# Patient Record
Sex: Female | Born: 1985 | Race: Black or African American | Hispanic: Yes | Marital: Married | Smoking: Never smoker
Health system: Southern US, Community
[De-identification: ages and names within clinical notes are randomized; demographics above are authoritative.]

## PROBLEM LIST (undated history)

## (undated) DIAGNOSIS — J4520 Mild intermittent asthma, uncomplicated: Secondary | ICD-10-CM

---

## 2020-11-12 ENCOUNTER — Encounter: Payer: Self-pay | Admitting: Internal Medicine

## 2020-11-12 ENCOUNTER — Emergency Department: Payer: 59

## 2020-11-12 ENCOUNTER — Other Ambulatory Visit: Payer: Self-pay

## 2020-11-12 ENCOUNTER — Inpatient Hospital Stay
Admission: EM | Admit: 2020-11-12 | Discharge: 2020-11-14 | DRG: 987 | Disposition: A | Discharge status: Home / Self Care | Payer: 59 | Attending: Internal Medicine | Admitting: Internal Medicine

## 2020-11-12 DIAGNOSIS — K810 Acute cholecystitis: Secondary | ICD-10-CM | POA: Diagnosis present

## 2020-11-12 DIAGNOSIS — I4891 Unspecified atrial fibrillation: Principal | ICD-10-CM | POA: Diagnosis present

## 2020-11-12 DIAGNOSIS — Z6841 Body Mass Index (BMI) 40.0 and over, adult: Secondary | ICD-10-CM | POA: Diagnosis not present

## 2020-11-12 DIAGNOSIS — A419 Sepsis, unspecified organism: Secondary | ICD-10-CM | POA: Diagnosis present

## 2020-11-12 DIAGNOSIS — Z20822 Contact with and (suspected) exposure to covid-19: Secondary | ICD-10-CM | POA: Diagnosis present

## 2020-11-12 DIAGNOSIS — J4520 Mild intermittent asthma, uncomplicated: Secondary | ICD-10-CM

## 2020-11-12 DIAGNOSIS — R1013 Epigastric pain: Secondary | ICD-10-CM | POA: Diagnosis present

## 2020-11-12 DIAGNOSIS — G4733 Obstructive sleep apnea (adult) (pediatric): Secondary | ICD-10-CM | POA: Diagnosis present

## 2020-11-12 DIAGNOSIS — R0789 Other chest pain: Secondary | ICD-10-CM

## 2020-11-12 DIAGNOSIS — E876 Hypokalemia: Secondary | ICD-10-CM | POA: Diagnosis present

## 2020-11-12 DIAGNOSIS — R112 Nausea with vomiting, unspecified: Secondary | ICD-10-CM | POA: Diagnosis present

## 2020-11-12 DIAGNOSIS — R002 Palpitations: Secondary | ICD-10-CM | POA: Diagnosis present

## 2020-11-12 HISTORY — DX: Morbid (severe) obesity due to excess calories: E66.01

## 2020-11-12 HISTORY — DX: Mild intermittent asthma, uncomplicated: J45.20

## 2020-11-12 LAB — COMPREHENSIVE METABOLIC PANEL
ALT: 19 U/L (ref 0–44)
AST: 30 U/L (ref 15–41)
Albumin: 4.2 g/dL (ref 3.5–5.0)
Alkaline Phosphatase: 70 U/L (ref 38–126)
Anion gap: 14 (ref 5–15)
BUN: 8 mg/dL (ref 6–20)
CO2: 23 mmol/L (ref 22–32)
Calcium: 9.1 mg/dL (ref 8.9–10.3)
Chloride: 99 mmol/L (ref 98–111)
Creatinine, Ser: 0.8 mg/dL (ref 0.44–1.00)
GFR, Estimated: >60 mL/min (ref >60)
Glucose, Bld: 116 mg/dL — ABNORMAL HIGH (ref 70–99)
Potassium: 3.6 mmol/L (ref 3.5–5.1)
Sodium: 136 mmol/L (ref 135–145)
Total Bilirubin: 0.8 mg/dL (ref 0.3–1.2)
Total Protein: 8.8 g/dL — ABNORMAL HIGH (ref 6.5–8.1)

## 2020-11-12 LAB — URINE DRUG SCREEN, QUALITATIVE (ARMC ONLY)
Amphetamines, Ur Screen: NOT DETECTED
Barbiturates, Ur Screen: NOT DETECTED
Benzodiazepine, Ur Scrn: NOT DETECTED
Cannabinoid 50 Ng, Ur ~~LOC~~: NOT DETECTED
Cocaine Metabolite,Ur ~~LOC~~: NOT DETECTED
MDMA (Ecstasy)Ur Screen: NOT DETECTED
Methadone Scn, Ur: NOT DETECTED
Opiate, Ur Screen: NOT DETECTED
Phencyclidine (PCP) Ur S: NOT DETECTED
Tricyclic, Ur Screen: NOT DETECTED

## 2020-11-12 LAB — CBC WITH DIFFERENTIAL/PLATELET
Abs Immature Granulocytes: 0.06 10*3/uL (ref 0.00–0.07)
Basophils Absolute: 0.1 10*3/uL (ref 0.0–0.1)
Basophils Relative: 1 %
Eosinophils Absolute: 0 10*3/uL (ref 0.0–0.5)
Eosinophils Relative: 0 %
HCT: 42.2 % (ref 36.0–46.0)
Hemoglobin: 13 g/dL (ref 12.0–15.0)
Immature Granulocytes: 0 %
Lymphocytes Relative: 11 %
Lymphs Abs: 1.6 10*3/uL (ref 0.7–4.0)
MCH: 23.3 pg — ABNORMAL LOW (ref 26.0–34.0)
MCHC: 30.8 g/dL (ref 30.0–36.0)
MCV: 75.8 fL — ABNORMAL LOW (ref 80.0–100.0)
Monocytes Absolute: 0.6 10*3/uL (ref 0.1–1.0)
Monocytes Relative: 4 %
Neutro Abs: 12.3 10*3/uL — ABNORMAL HIGH (ref 1.7–7.7)
Neutrophils Relative %: 84 %
Platelets: 290 10*3/uL (ref 150–400)
RBC: 5.57 MIL/uL — ABNORMAL HIGH (ref 3.87–5.11)
RDW: 14.6 % (ref 11.5–15.5)
WBC: 14.6 10*3/uL — ABNORMAL HIGH (ref 4.0–10.5)
nRBC: 0 % (ref 0.0–0.2)

## 2020-11-12 LAB — URINALYSIS, ROUTINE W REFLEX MICROSCOPIC
Bacteria, UA: NONE SEEN
Bilirubin Urine: NEGATIVE
Glucose, UA: NEGATIVE mg/dL
Ketones, ur: 20 mg/dL — AB
Leukocytes,Ua: NEGATIVE
Nitrite: NEGATIVE
Protein, ur: 30 mg/dL — AB
Specific Gravity, Urine: 1.011 (ref 1.005–1.030)
WBC, UA: NONE SEEN WBC/hpf (ref 0–5)
pH: 7 (ref 5.0–8.0)

## 2020-11-12 LAB — LACTIC ACID, PLASMA
Lactic Acid, Venous: 1.5 mmol/L (ref 0.5–1.9)
Lactic Acid, Venous: 1.8 mmol/L (ref 0.5–1.9)

## 2020-11-12 LAB — RESP PANEL BY RT-PCR (FLU A&B, COVID) ARPGX2
Influenza A by PCR: NEGATIVE
Influenza B by PCR: NEGATIVE
SARS Coronavirus 2 by RT PCR: NEGATIVE

## 2020-11-12 LAB — LIPASE, BLOOD: Lipase: 24 U/L (ref 11–51)

## 2020-11-12 LAB — TROPONIN I (HIGH SENSITIVITY)
Troponin I (High Sensitivity): 14 ng/L (ref <18)
Troponin I (High Sensitivity): 14 ng/L (ref <18)

## 2020-11-12 LAB — MAGNESIUM: Magnesium: 1.8 mg/dL (ref 1.7–2.4)

## 2020-11-12 LAB — POC URINE PREG, ED: Preg Test, Ur: NEGATIVE

## 2020-11-12 MED ORDER — ALBUTEROL SULFATE (2.5 MG/3ML) 0.083% IN NEBU: 2.5000 mg | INHALATION_SOLUTION | RESPIRATORY_TRACT | Status: DC | PRN

## 2020-11-12 MED ORDER — DILTIAZEM HCL 25 MG/5ML IV SOLN
20.0000 mg | Freq: Once | INTRAVENOUS | Status: AC
  Administered 2020-11-12: 20 mg via INTRAVENOUS
  Filled 2020-11-12: qty 5

## 2020-11-12 MED ORDER — ACETAMINOPHEN 650 MG RE SUPP: 650.0000 mg | Freq: Four times a day (QID) | RECTAL | Status: DC | PRN

## 2020-11-12 MED ORDER — PIPERACILLIN-TAZOBACTAM 3.375 G IVPB 30 MIN
3.3750 g | Freq: Once | INTRAVENOUS | Status: AC
  Administered 2020-11-12: 3.375 g via INTRAVENOUS
  Filled 2020-11-12: qty 50

## 2020-11-12 MED ORDER — NALOXONE HCL 2 MG/2ML IJ SOSY
0.4000 mg | PREFILLED_SYRINGE | INTRAMUSCULAR | Status: DC | PRN
  Filled 2020-11-12: qty 2

## 2020-11-12 MED ORDER — LACTATED RINGERS IV SOLN: INTRAVENOUS | Status: DC

## 2020-11-12 MED ORDER — HYDROMORPHONE HCL 1 MG/ML IJ SOLN
1.0000 mg | INTRAMUSCULAR | Status: DC | PRN
  Administered 2020-11-13: 1 mg via INTRAVENOUS
  Filled 2020-11-12: qty 1

## 2020-11-12 MED ORDER — ONDANSETRON HCL 4 MG/2ML IJ SOLN: 4.0000 mg | Freq: Four times a day (QID) | INTRAMUSCULAR | Status: DC | PRN

## 2020-11-12 MED ORDER — ACETAMINOPHEN 325 MG PO TABS
650.0000 mg | ORAL_TABLET | Freq: Four times a day (QID) | ORAL | Status: DC | PRN
  Administered 2020-11-13 – 2020-11-14 (×3): 650 mg via ORAL
  Filled 2020-11-12 (×3): qty 2

## 2020-11-12 MED ORDER — DILTIAZEM HCL 25 MG/5ML IV SOLN
INTRAVENOUS | Status: AC
  Filled 2020-11-12: qty 5

## 2020-11-12 MED ORDER — MORPHINE SULFATE (PF) 4 MG/ML IV SOLN: 4.0000 mg | INTRAVENOUS | Status: DC | PRN

## 2020-11-12 MED ORDER — PIPERACILLIN-TAZOBACTAM 3.375 G IVPB
3.3750 g | Freq: Three times a day (TID) | INTRAVENOUS | Status: DC
  Administered 2020-11-13 (×2): 3.375 g via INTRAVENOUS
  Filled 2020-11-12 (×5): qty 50

## 2020-11-12 MED ORDER — MORPHINE SULFATE (PF) 4 MG/ML IV SOLN
4.0000 mg | Freq: Once | INTRAVENOUS | Status: AC
  Administered 2020-11-12: 4 mg via INTRAVENOUS
  Filled 2020-11-12: qty 1

## 2020-11-12 MED ORDER — DILTIAZEM LOAD VIA INFUSION
10.0000 mg | Freq: Once | INTRAVENOUS | Status: AC
  Administered 2020-11-12: 10 mg via INTRAVENOUS
  Filled 2020-11-12: qty 10

## 2020-11-12 MED ORDER — DILTIAZEM HCL-DEXTROSE 125-5 MG/125ML-% IV SOLN (PREMIX)
5.0000 mg/h | INTRAVENOUS | Status: DC
  Administered 2020-11-12: 5 mg/h via INTRAVENOUS
  Administered 2020-11-13: 15 mg/h via INTRAVENOUS
  Filled 2020-11-12 (×2): qty 125

## 2020-11-12 MED ORDER — SODIUM CHLORIDE 0.9 % IV BOLUS
1000.0000 mL | Freq: Once | INTRAVENOUS | Status: AC
  Administered 2020-11-12: 1000 mL via INTRAVENOUS

## 2020-11-12 NOTE — ED Triage Notes (Signed)
Pt BIB EMS from home c/o rapid HR and mid sternal CP that started around 1000. Pt endorses N/V. Pt has no cardiac hx

## 2020-11-12 NOTE — Progress Notes (Signed)
Pharmacy Antibiotic Note  Gail Wilkinson is a 34 y.o. female admitted on 11/12/2020 with Intra-abdominal infection.  Pharmacy has been consulted for Zosyn dosing.  Plan: Zosyn 3.375g IV q8h (4 hour infusion).     Temp (24hrs), Avg:98.2 F (36.8 C), Min:98.2 F (36.8 C), Max:98.2 F (36.8 C)  Recent Labs  Lab 11/12/20 1545  WBC 14.6*  CREATININE 0.80    CrCl cannot be calculated (Unknown ideal weight.).    Allergies  Allergen Reactions  . Shellfish Allergy Itching    Antimicrobials this admission: Zosyn 12/9 >>   Dose adjustments this admission:  Microbiology results:  Thank you for allowing pharmacy to be a part of this patient's care.  Clovia Cuff, PharmD, BCPS 11/12/2020 8:19 PM

## 2020-11-12 NOTE — H&P (Signed)
History and Physical    PLEASE NOTE THAT DRAGON DICTATION SOFTWARE WAS USED IN THE CONSTRUCTION OF THIS NOTE.   Gail Wilkinson RSW:[id number] DOB: 1986/11/13 DOA: 11/12/2020  PCP: Patient, No Pcp Per Patient coming from: home   I have personally briefly reviewed patient's old medical records in [place]  Chief Complaint: Palpitations  HPI: Gail Wilkinson is a 34 y.o. female with medical history significant for mild intermittent asthma who is admitted to [facility] on 11/12/2020 with atrial fibrillation with RVR after presenting from home to [facility] complaining of palpitations.   The patient reports 1 day of palpitations associated with lightheadedness in the absence of any syncope.  She also reports intermittent nausea resulting in 3 episodes of nonbloody, nonbilious emesis over that timeframe.  Denies associated overt chest discomfort, diaphoresis, or shortness of breath.  Over the last day she has also noted new onset epigastric discomfort.  She describes this pain as sharp, nonradiating pain, that is exacerbated with palpation over the epigastrium.  Denies any recent trauma.  Denies any associated diarrhea, melena, or hematochezia.    Denies subjective fever, chills, rigors, or generalized myalgias.  Denies any associated recent headaches, neck stiffness, rhinitis, rhinorrhea, sore throat, wheezing, or rash.  No recent traveling or known COVID-19 exposures.  Denies any associated dysuria, gross hematuria, or change in urinary urgency/frequency.   The patient conveys that she has no known underlying history of atrial fibrillation or any known history of gallbladder pathology.  Not on any blood thinning agents at home.  Her past medical history appears limited to mild intermittent, with most recent exacerbation of such occurring approximately 4 years ago in 2017.  Denies any routine consumption of alcohol and denies any recreational drug  use.    ED Course:  Vital signs in the ED were notable for the following: Temperature max 98.2; heart rate initially noted in the 150s to 160s which is decreased slightly into the 120s to 140s following initiation of diltiazem drip, as further described below; blood pressure 144/98; respiratory rate 17-22; oxygen saturation 97 to 100% on room air.  Labs were notable for the following: CMP was notable for the following: Sodium 136, potassium 3.6, bicarbonate 23, anion gap 14, BUN 18, creatinine 0.8.  Liver enzymes were found to be within normal limits, including hyperbilirubinemia.  Urine pregnancy was found to be negative today.  Serum magnesium 1.8.  High-sensitivity troponin I x2 were found to be 14.  CBC was notable for the following: White blood cell count of 14,600 with 84% neutrophils, hemoglobin 13.  Nasopharyngeal COVID-19/influenza PCR performed in the ED this evening found to be negative.  Chest x-ray showed no evidence of acute cardiopulmonary process, clear no evidence of infiltrate, edema, effusion, or pneumothorax.  Right upper quadrant abdominal ultrasound showed evidence of cholelithiasis associated with gallbladder wall thickening and trace free pericholecystic fluid suggestive of acute cholelithiasis, while showing mild dilation of the common bile duct to 7.2 mm in the absence of any overt radiopaque stone within the common bile duct.  Presenting EKG showed atrial fibrillation with RVR, ventricular rate 160, and nonspecific T wave inversion in lead III, without prior EKG available for point comparison, while showing no evidence is, including no evidence of ST elevation.  The patient's case and imaging were discussed with the on-call general surgeon, Dr. Lysle Pearl, who agreed that presentation, findings, and imaging results were suggestive of acute cholecystitis without clinical suggestion of choledocholithiasis. Dr. Lysle Pearl will plan to  take patient to the OR tomorrow for acute cholecystitis  so as adequate rate control occurred in the interval.  While in the ED, the following were administered: Diltiazem 10 mg IV bolus x1 followed by initiation of diltiazem drip.  Morphine 4 L IV x1, a 1 L normal saline bolus, and 1 dose of IV Zosyn per pharmacy consultation.     Review of Systems: As per HPI otherwise 10 point review of systems negative.   Past Medical History:  Diagnosis Date  . Mild intermittent asthma     History reviewed. No pertinent surgical history.  Social History:  reports that she has never smoked. She has never used smokeless tobacco. She reports previous alcohol use. She reports that she does not use drugs.   Allergies  Allergen Reactions  . Shellfish Allergy Itching    Prior to Admission medications   Not on File  As needed albuterol inhaler represents the patient's only prescription medication at home.  She denies any use of over-the-counter medications.   Objective    Physical Exam: Vitals:   11/12/20 1815 11/12/20 1830 11/12/20 1836 11/12/20 1845  BP:  (!) 156/127 (!) 144/98   Pulse: (!) 150  91 (!) 140  Resp: (!) 22  (!) 22 (!) 25  Temp:      TempSrc:      SpO2: 100%  98% 93%    General: appears to be stated age; alert, oriented Skin: warm, dry, no rash Head:  AT/[place] Eyes:  PEARL b/l, EOMI Mouth:  Oral mucosa membranes appear dry, normal dentition Neck: supple; trachea midline Heart:  tachycardic; did not appreciate any M/R/G Lungs: CTAB, did not appreciate any wheezes, rales, or rhonchi Abdomen: + BS; soft, ND; mild epigastric bruit to palpation over that site in the absence of any associated guarding, rigidity, or rebound tenderness. Vascular: 2+ pedal pulses b/l; 2+ radial pulses b/l Extremities: no peripheral edema, no muscle wasting Neuro: strength and sensation intact in upper and lower extremities b/l    Labs on Admission: I have personally reviewed following labs and imaging studies  CBC: Recent Labs  Lab  11/12/20 1545  WBC 14.6*  NEUTROABS 12.3*  HGB 13.0  HCT 42.2  MCV 75.8*  PLT 383   Basic Metabolic Panel: Recent Labs  Lab 11/12/20 1545  NA 136  K 3.6  CL 99  CO2 23  GLUCOSE 116*  BUN 8  CREATININE 0.80  CALCIUM 9.1  MG 1.8   GFR: CrCl cannot be calculated (Unknown ideal weight.). Liver Function Tests: Recent Labs  Lab 11/12/20 1545  AST 30  ALT 19  ALKPHOS 70  BILITOT 0.8  PROT 8.8*  ALBUMIN 4.2   Recent Labs  Lab 11/12/20 1545  LIPASE 24   No results for input(s): AMMONIA in the last 168 hours. Coagulation Profile: No results for input(s): INR, PROTIME in the last 168 hours. Cardiac Enzymes: No results for input(s): CKTOTAL, CKMB, CKMBINDEX, TROPONINI in the last 168 hours. BNP (last 3 results) No results for input(s): PROBNP in the last 8760 hours. HbA1C: No results for input(s): HGBA1C in the last 72 hours. CBG: No results for input(s): GLUCAP in the last 168 hours. Lipid Profile: No results for input(s): CHOL, HDL, LDLCALC, TRIG, CHOLHDL, LDLDIRECT in the last 72 hours. Thyroid Function Tests: No results for input(s): TSH, T4TOTAL, FREET4, T3FREE, THYROIDAB in the last 72 hours. Anemia Panel: No results for input(s): VITAMINB12, FOLATE, FERRITIN, TIBC, IRON, RETICCTPCT in the last 72 hours. Urine analysis: No  results found for: COLORURINE, APPEARANCEUR, [place], [place], GLUCOSEU, HGBUR, BILIRUBINUR, KETONESUR, PROTEINUR, UROBILINOGEN, NITRITE, LEUKOCYTESUR  Radiological Exams on Admission: DG Chest Portable 1 View  Result Date: 11/12/2020 CLINICAL DATA:  new onset Afib, eval infiltrate EXAM: PORTABLE CHEST 1 VIEW COMPARISON:  None. FINDINGS: No focal consolidation. No pneumothorax or pleural effusion. Cardiomediastinal silhouette is within normal limits. No acute osseous abnormality. IMPRESSION: No focal airspace disease. Electronically Signed   By: Primitivo Gauze M.D.   On: 11/12/2020 16:12   US Abdomen Limited RUQ (LIVER/GB)  Result  Date: 11/12/2020 CLINICAL DATA:  Initial evaluation for acute epigastric pain, vomiting. EXAM: ULTRASOUND ABDOMEN LIMITED RIGHT UPPER QUADRANT COMPARISON:  None. FINDINGS: Gallbladder: Multiple shadowing echogenic stones seen within the gallbladder lumen, largest of which measures up to 1.6 cm. Gallbladder wall thickened and edematous in appearance measuring up to 10-11 mm. Probable trace free pericholecystic fluid. No sonographic Murphy sign elicited on exam. Common bile duct: Diameter: 7.2 mm Liver: 1.1 x 0.8 x 0.8 cm benign appearing cyst noted within the left hepatic lobe. 1.0 x 1.0 x 1.0 cm cyst noted within the right hepatic lobe. Additional right hepatic lobe cysts measure 0.9 x 1.0 x 0.8 cm. Increased echogenicity within the underlying hepatic parenchyma. Portal vein is patent on color Doppler imaging with normal direction of blood flow towards the liver. Other: None. IMPRESSION: 1. Cholelithiasis with associated gallbladder wall thickening and trace free pericholecystic fluid. Clinical correlation for possible acute cholecystitis recommended. 2. Mild dilatation of the common bile duct measuring up to 7.2 mm. Correlation with LFTs recommended. 3. Increased echogenicity within the hepatic parenchyma, nonspecific, but most commonly seen in the setting of steatosis. 4. Few scattered benign-appearing hepatic cysts measuring up to 1.1 cm as above. Electronically Signed   By: Jeannine Boga M.D.   On: 11/12/2020 18:42     EKG: Independently reviewed, with result as described above.    Assessment/Plan   Gail Wilkinson is a 34 y.o. female with medical history significant for mild intermittent asthma who is admitted to [facility] on 11/12/2020 with atrial fibrillation with RVR after presenting from home to [facility] complaining of palpitations.     Principal Problem:   Atrial fibrillation with RVR (HCC) Active Problems:   Acute cholecystitis   Sepsis  (HCC)   Epigastric pain   Mild intermittent asthma    #) Atrial fibrillation with RVR: This appears to be a new diagnosis for the with presenting with 1 day of palpitations associated with lightheadedness and intermittent nausea resulting in 3 episodes of nonbloody, nonbilious emesis, with heart monitor showing atrial fibrillation with RVR and initial ventricular rates into the 170s, while maintaining blood pressures.  It appears her atrial fibrillation with RVR is precipitated by new finding of sepsis due to acute cholecystitis, as further described.  No overt additional acute contributing factors leading atrial fibrillation with RVR, including no additional overt signs of underlying infectious process, including chest x-ray which showed no evidence of acute pulmonary process, while COVID-19 PCR performed this evening was found to be negative.  will also check urinalysis to evaluate for any additional source of underlying infection.  Denies any associated chest pain, and high-sensitivity troponin I times to evaluate not elevated at 14.  Diltiazem drip was initiated in the ED following a 10 mg IV diltiazem bolus.  Rate appears to be improving, but not yet to goal.  Blood pressure appears to be tolerating the AV nodal blocking interventions.  We will also strive  to improve pain control, as this may represent an additional sympathetic factor playing a role in her atrial fibrillation with RVR. CHA2DS2-VASc of 1 does not meet criteria for anticoagulation for thromboembolic prophylaxis.   Overall, will treat the underlying precipitating patient's atrial fibrillation with RVR, which appears to be Stormy Connon sepsis due to acute cholecystitis, as further described below, with general plan to initiate broad-spectrum antibiotics this evening, before subsequently going to the OR acute cholecystectomy so long as adequate rate control accomplished in the meantime.   Plan: Continue diltiazem drip with goal to maintain  ventricular rates greater than 120 bpm.  Work-up and management of suspected precipitating sepsis due to acute cholecystitis, as further described below.  Add on serum magnesium level.  Check TSH, check urinalysis, check urinary drug screen.  Monitor on telemetry.  Monitor continuous pulse oximetry.  At some point during his hospitalization, following improvement in rate control, anticipate pursuit of echocardiogram.      #) Sepsis due to acute cholecystitis: Diagnosis on the basis of 1 day of epigastric pain associated with intermittent nausea resulting in 3 episodes of nonbloody, nonbilious emesis Gail Wilkinson quadrant ultrasound performed in the ED today showing evidence of cholelithiasis associated with gallbladder wall thickening and trace free pericholecystic fluid suggestive of acute cholecystitis radiopaque choledocholithiasis.  Furthermore, presenting labs are not consistent with a cholestatic for the absence of an occluding stone within the common bile duct at this time.  The patient's case and imaging were discussed with the on-call general surgeon, Dr. Lysle Pearl, who agrees that presentation laboratory studies, and imaging studies, and he plans to take the patient to the OR tomorrow for acute cholecystectomy but rate control has been achieved by that time.  SIRS criteria met via presenting leukocytosis, tachycardia, and tachypnea.  No evidence to suggest associated endorgan damage, and in this context, criteria do not appear to be met for patient sepsis to be considered severe in nature.  No evidence of hypotension.  Lactic acid has been ordered, with result currently pending.  The patient received broad-spectrum antibiotics including anaerobic coverage in the form of Zosyn in the ED tonight.  No additional source of described above, although will check urinalysis to further rule out any additional possibility.  Plan: Lactated Ringer's at 75 cc/h.  Check lactic acid now.  Check blood cultures x2.  I  placed a pharmacy consult for continuation of Zosyn.  Repeat CBC with differential in the morning change as needed IV morphine to as needed IV Dilaudid with the hope of achieving pain control.  Repeat CMP in the morning.  Formal general surgery consult, as above.  Admit the patient n.p.o. at midnight.  Check urinalysis.  As needed IV Zofran.     #) Epigastric pain: 1 day duration, as described above, which appears consistent with acute cholecystitis, with physical exam showing no evidence of peritoneal signs at this time.  Will change from as needed IV morphine as needed IV Dilaudid in an attempt to establish improved pain control, as above.  Plan: Work-up and management of acute cholecystitis, as above.  As needed IV morphine.  Start as needed IV Dilaudid.    #) Nausea and vomiting: The patient reports nausea resulting in 3 episodes of nonbloody, nonbilious emesis sepsis due to acute cholecystitis consequential atrial fibrillation with RVR.   Plan: As needed IV Zofran.  Gentle IV fluids in the form of continuous lactated Ringer's.  Monitor strict I's and O's and daily weights.  We will repeat CMP in the  morning close attention to interval renal function as well associated electrolytes.  Will recheck serum magnesium level in the morning as well.     #) Mild intermittent asthma: On as needed albuterol as her sole outpatient respiratory regimen.  She reports that her most recent prior acute asthma exacerbation occurred in 2017.  No clinical evidence to suggest asthma exacerbation at this time.  Plan: As needed albuterol inhaler.    DVT prophylaxis: SCDs Code Status: Full code Family Communication: Case was discussed with husband Disposition Plan: Per Rounding Team Consults called: Patient's case and imaging were discussed with the on-call general surgeon, as above Admission status: Inpatient; stepdown unit   Of note, this patient was added by me to the following Admit List/Treatment  Team:  [email or web address]     PLEASE NOTE THAT DRAGON DICTATION SOFTWARE WAS USED IN THE CONSTRUCTION OF THIS NOTE.   [facility] Pager [phone] From 12PM- 12AM  Otherwise, please contact night-coverage  [email or web address] Password [email or web address]  11/12/2020, 7:17 PM

## 2020-11-12 NOTE — ED Provider Notes (Signed)
[facility] Emergency Department Provider Note ____________________________________________   Event Date/Time   First MD Initiated Contact with Patient 11/12/20 1525     (approximate)  I have reviewed the triage vital signs and the nursing notes.  HISTORY  Chief Complaint Chest Pain   HPI Gail Wilkinson is a 34 y.o. femalewho presents to the ED for evaluation of epigastric discomfort and palpitations.  Chart review indicates no relevant medical history. Patient self-reports a history of mild asthma with a rescue inhaler at home, and she reports her last asthma attack was in 2017.  She reports using her inhaler once yesterday.  Patient reports being in her typical state of health until today when she developed a sensation of generalized weakness, epigastric discomfort and shortness of breath.  She reports some intermittent sensation of heart palpitations.  She reports that she feels chest/epigastric pain right now, up to 4/10 intensity.  She denies any syncopal episodes, but does report some mild presyncopal lightheaded dizziness.  Further reports 3 episodes of nonbloody nonbilious emesis today.   LMP was last week.  No dysuria, lower abdominal pain, diarrhea, cough, shortness of breath.  She is fully vaccinated for COVID-19 with 2 shots but no booster.   No past medical history on file.  There are no problems to display for this patient.   Prior to Admission medications   Not on File    Allergies Shellfish allergy  No family history on file.  Social History    Review of Systems  Constitutional: No fever/chills.  Positive generalized weakness and presyncope Eyes: No visual changes. ENT: No sore throat. Cardiovascular: Positive for chest pain. Respiratory: Denies shortness of breath. Gastrointestinal: No abdominal pain.  No diarrhea.  No constipation. Positive for nausea and vomiting. Genitourinary: Negative for dysuria. Musculoskeletal:  Negative for back pain. Skin: Negative for rash. Neurological: Negative for headaches, focal weakness or numbness.  ____________________________________________   PHYSICAL EXAM:  VITAL SIGNS: Vitals:   11/12/20 1836 11/12/20 1845  BP: (!) 144/98   Pulse: 91 (!) 140  Resp: (!) 22 (!) 25  Temp:    SpO2: 98% 93%     Constitutional: Alert and oriented.  Obese, sitting up in the stretcher.  Conversational in full sentences.  Appears uncomfortable. Eyes: Conjunctivae are normal. PERRL. EOMI. Head: Atraumatic. Nose: No congestion/rhinnorhea. Mouth/Throat: Mucous membranes are moist.  Oropharynx non-erythematous. Neck: No stridor. No cervical spine tenderness to palpation. Cardiovascular: Tachycardic and irregular. Grossly normal heart sounds.  Good peripheral circulation. Respiratory: Normal respiratory effort.  No retractions. Lungs CTAB. Gastrointestinal: Soft , nondistended, nontender to palpation. No CVA tenderness. Musculoskeletal: No lower extremity tenderness nor edema.  No joint effusions. No signs of acute trauma. Neurologic:  Normal speech and language. No gross focal neurologic deficits are appreciated. No gait instability noted. Skin:  Skin is warm, dry and intact. No rash noted. Psychiatric: Mood and affect are normal. Speech and behavior are normal.  ____________________________________________   LABS (all labs ordered are listed, but only abnormal results are displayed)  Labs Reviewed  CBC WITH DIFFERENTIAL/PLATELET - Abnormal; Notable for the following components:      Result Value   WBC 14.6 (*)    RBC 5.57 (*)    MCV 75.8 (*)    MCH 23.3 (*)    Neutro Abs 12.3 (*)    All other components within normal limits  COMPREHENSIVE METABOLIC PANEL - Abnormal; Notable for the following components:   Glucose, Bld 116 (*)    Total  Protein 8.8 (*)    All other components within normal limits  RESP PANEL BY RT-PCR (FLU A&B, COVID) ARPGX2  MAGNESIUM  LIPASE, BLOOD   POC URINE PREG, ED  TROPONIN I (HIGH SENSITIVITY)  TROPONIN I (HIGH SENSITIVITY)   ____________________________________________  12 Lead EKG  A. fib, rate of 168 bpm.  Normal axis.  Appropriate intervals.  No upsloping delta waves to suggest WPW.  T wave inversions to inferior leads, no further evidence of acute ischemia or STEMI criteria. ____________________________________________  RADIOLOGY  ED MD interpretation: One view CXR reviewed by me without evidence of acute cardiopulmonary pathology. RUQ ultrasound reviewed by me with evidence of cholelithiasis and cholecystitis with pericholecystic fluid and gallbladder wall thickening.  Official radiology report(s): DG Chest Portable 1 View  Result Date: 11/12/2020 CLINICAL DATA:  new onset Afib, eval infiltrate EXAM: PORTABLE CHEST 1 VIEW COMPARISON:  None. FINDINGS: No focal consolidation. No pneumothorax or pleural effusion. Cardiomediastinal silhouette is within normal limits. No acute osseous abnormality. IMPRESSION: No focal airspace disease. Electronically Signed   By: Stana Bunting M.D.   On: 11/12/2020 16:12   US Abdomen Limited RUQ (LIVER/GB)  Result Date: 11/12/2020 CLINICAL DATA:  Initial evaluation for acute epigastric pain, vomiting. EXAM: ULTRASOUND ABDOMEN LIMITED RIGHT UPPER QUADRANT COMPARISON:  None. FINDINGS: Gallbladder: Multiple shadowing echogenic stones seen within the gallbladder lumen, largest of which measures up to 1.6 cm. Gallbladder wall thickened and edematous in appearance measuring up to 10-11 mm. Probable trace free pericholecystic fluid. No sonographic Murphy sign elicited on exam. Common bile duct: Diameter: 7.2 mm Liver: 1.1 x 0.8 x 0.8 cm benign appearing cyst noted within the left hepatic lobe. 1.0 x 1.0 x 1.0 cm cyst noted within the right hepatic lobe. Additional right hepatic lobe cysts measure 0.9 x 1.0 x 0.8 cm. Increased echogenicity within the underlying hepatic parenchyma. Portal vein is  patent on color Doppler imaging with normal direction of blood flow towards the liver. Other: None. IMPRESSION: 1. Cholelithiasis with associated gallbladder wall thickening and trace free pericholecystic fluid. Clinical correlation for possible acute cholecystitis recommended. 2. Mild dilatation of the common bile duct measuring up to 7.2 mm. Correlation with LFTs recommended. 3. Increased echogenicity within the hepatic parenchyma, nonspecific, but most commonly seen in the setting of steatosis. 4. Few scattered benign-appearing hepatic cysts measuring up to 1.1 cm as above. Electronically Signed   By: Rise Mu M.D.   On: 11/12/2020 18:42    ____________________________________________   PROCEDURES and INTERVENTIONS  Procedure(s) performed (including Critical Care):  .Critical Care Performed by: Delton Prairie, MD Authorized by: Delton Prairie, MD   Critical care provider statement:    Critical care time (minutes):  45   Critical care was necessary to treat or prevent imminent or life-threatening deterioration of the following conditions:  Circulatory failure and cardiac failure   Critical care was time spent personally by me on the following activities:  Discussions with consultants, evaluation of patient's response to treatment, examination of patient, ordering and performing treatments and interventions, ordering and review of laboratory studies, ordering and review of radiographic studies, pulse oximetry, re-evaluation of patient's condition, obtaining history from patient or surrogate and review of old charts    Medications  diltiazem (CARDIZEM) 1 mg/mL load via infusion 10 mg (10 mg Intravenous Bolus from Bag 11/12/20 1606)    And  diltiazem (CARDIZEM) 125 mg in dextrose 5% 125 mL (1 mg/mL) infusion (15 mg/hr Intravenous Rate/Dose Change 11/12/20 1719)  sodium chloride 0.9 % bolus  1,000 mL (has no administration in time range)  piperacillin-tazobactam (ZOSYN) IVPB 3.375 g (has  no administration in time range)  diltiazem (CARDIZEM) injection 20 mg (20 mg Intravenous Given 11/12/20 1546)  morphine 4 MG/ML injection 4 mg (4 mg Intravenous Given 11/12/20 1716)    ____________________________________________   MDM / ED COURSE   Obese 34 year old woman presents to the ED with palpitations and chest pain most consistent with new onset A. fib with RVR precipitated by acute cholecystitis requiring medical admission.  Patient tachycardic with A. fib with RVR rates 140-160, but hemodynamically stable and not hypoxic.  Exam demonstrates an uncomfortable appearing patient who has some mild RUQ and epigastric tenderness to palpation without peritoneal features.  She has no evidence of trauma, significant distress or any neurovascular deficits.  Blood work with leukocytosis, otherwise unremarkable.  CXR shows no evidence of acute cardiopulmonary pathology or infiltrates.  Twelve-lead EKG demonstrates A. fib with RVR, which is new for the patient.  Patient tolerates diltiazem boluses well with transient provement of her rates, and was started on a diltiazem drip.  Due to her continued pain, leukocytosis, RUQ ultrasound performed and demonstrates evidence of cholecystitis and the likely precipitating factor causing her A. fib with RVR.  Spoke with general surgery on-call who agrees to see the patient and indicates that we will likely perform cholecystectomy tomorrow.  We will admit the patient to hospitalist medicine for further work-up and management of her A. fib with cholecystitis.   Clinical Course as of 11/12/20 1858  Thu Nov 12, 2020  1559 Transient response after 20 mg of IV diltiazem with rates decreasing to 120s, promptly popped back up to 140/160s.  We will bolus another 2 mg and start a drip.  Patient reports improved chest pain transiently during those decreased rates [DS]  1706 Reassessed.  Husband at the bedside provides additional history.  Patient reports continued epigastric  discomfort despite improving rate control.  She is reporting some tenderness to her epigastrium and RUQ now that she was not before upon my palpation.  Considering her leukocytosis, demographic/body habitus, I am concerned with the possibility of cholelithiasis or cholecystitis contributing to her symptoms and possibly precipitating her A. fib with RVR.  We will order RUQ ultrasound. [DS]  1847 Ultrasound reviewed and noted.  Dr. Tonna Boehringer with general surgery was paged [DS]  1850 Updated patient on diagnosis of acute cholecystitis. [DS]  1610 Spoke with Dr. Tonna Boehringer who agrees to come evaluate the patient later this afternoon.  Indicates that hospitalist admission is warranted considering A. fib with RVR and a dill drip.  He expects to perform cholecystectomy tomorrow pending any changes [DS]    Clinical Course User Index [DS] Delton Prairie, MD    ____________________________________________   FINAL CLINICAL IMPRESSION(S) / ED DIAGNOSES  Final diagnoses:  New onset a-fib (HCC)  Atrial fibrillation with RVR (HCC)  Other chest pain  Epigastric abdominal pain  Acute cholecystitis     ED Discharge Orders    None       Dayna Geurts   Note:  This document was prepared using Dragon voice recognition software and may include unintentional dictation errors.   Delton Prairie, MD 11/12/20 1900

## 2020-11-12 NOTE — Consult Note (Signed)
Subjective:   CC: acute choelcsytitis  HPI:  Gail Wilkinson is a 34 y.o. female who is consulted by Katrinka Blazing for evaluation of above cc.  Symptoms were first noted 1 day ago. Pain is epigastric and chest.  Associated with N/V, exacerbated by nothing specific.  First episode, but has been dealing with "heartburn" for past week.    Past Medical History:  has a past medical history of Mild intermittent asthma.  Past Surgical History: none reported  Family History: reviewed and not relevant to CC  Social History:  reports that she has never smoked. She has never used smokeless tobacco. She reports previous alcohol use. She reports that she does not use drugs.  Current Medications:  Prior to Admission medication   on File    Allergies:  Allergies as of 11/12/2020 - never reviewed  Allergen Reaction Noted  . Shellfish allergy Itching 11/12/2020    ROS:  General: Denies weight loss, weight gain, fatigue, fevers, chills, and night sweats. Eyes: Denies blurry vision, double vision, eye pain, itchy eyes, and tearing. Ears: Denies hearing loss, earache, and ringing in ears. Nose: Denies sinus pain, congestion, infections, runny nose, and nosebleeds. Mouth/throat: Denies hoarseness, sore throat, bleeding gums, and difficulty swallowing. Heart: Denies chest pain, palpitations, racing heart, irregular heartbeat, leg pain or swelling, and decreased activity tolerance. Respiratory: Denies breathing difficulty, shortness of breath, wheezing, cough, and sputum. GI: Denies change in appetite, heartburn, nausea, vomiting, constipation, diarrhea, and blood in stool. GU: Denies difficulty urinating, pain with urinating, urgency, frequency, blood in urine. Musculoskeletal: Denies joint stiffness, pain, swelling, muscle weakness. Skin: Denies rash, itching, mass, tumors, sores, and boils Neurologic: Denies headache, fainting, dizziness, seizures, numbness, and tingling. Psychiatric: Denies depression,  anxiety, difficulty sleeping, and memory loss. Endocrine: Denies heat or cold intolerance, and increased thirst or urination. Blood/lymph: Denies easy bruising, and swollen glands     Objective:     BP (!) 178/115   Pulse 79   Temp 98.2 F (36.8 C) (Oral)   Resp (!) 24   LMP 10/29/2020   SpO2 97%    Constitutional :  alert, cooperative and appears stated age  Lymphatics/Throat:  no asymmetry, masses, or scars  Respiratory:  clear to auscultation bilaterally  Cardiovascular:  regular rate and rhythm  Gastrointestinal: soft, focal TTP in RUQ.   Musculoskeletal: Steady movement  Skin: Cool and moist  Psychiatric: Normal affect, non-agitated, not confused       LABS:  CMP Latest Ref Rng & Units 11/12/2020  Glucose 70 - 99 mg/dL 627(O)  BUN 6 - 20 mg/dL 8  Creatinine 3.50 - 0.93 mg/dL 8.18  Sodium 299 - 371 mmol/L 136  Potassium 3.5 - 5.1 mmol/L 3.6  Chloride 98 - 111 mmol/L 99  CO2 22 - 32 mmol/L 23  Calcium 8.9 - 10.3 mg/dL 9.1  Total Protein 6.5 - 8.1 g/dL 6.9(C)  Total Bilirubin 0.3 - 1.2 mg/dL 0.8  Alkaline Phos 38 - 126 U/L 70  AST 15 - 41 U/L 30  ALT 0 - 44 U/L 19   CBC Latest Ref Rng & Units 11/12/2020  WBC 4.0 - 10.5 K/uL 14.6(H)  Hemoglobin 12.0 - 15.0 g/dL 78.9  Hematocrit 38.1 - 46.0 % 42.2  Platelets 150 - 400 K/uL 290     RADS: CLINICAL DATA:  Initial evaluation for acute epigastric pain, vomiting.  EXAM: ULTRASOUND ABDOMEN LIMITED RIGHT UPPER QUADRANT  COMPARISON:  None.  FINDINGS: Gallbladder:  Multiple shadowing echogenic stones seen within the gallbladder lumen,  largest of which measures up to 1.6 cm. Gallbladder wall thickened and edematous in appearance measuring up to 10-11 mm. Probable trace free pericholecystic fluid. No sonographic Murphy sign elicited on exam.  Common bile duct:  Diameter: 7.2 mm  Liver:  1.1 x 0.8 x 0.8 cm benign appearing cyst noted within the left hepatic lobe. 1.0 x 1.0 x 1.0 cm cyst noted  within the right hepatic lobe. Additional right hepatic lobe cysts measure 0.9 x 1.0 x 0.8 cm. Increased echogenicity within the underlying hepatic parenchyma. Portal vein is patent on color Doppler imaging with normal direction of blood flow towards the liver.  Other: None.  IMPRESSION: 1. Cholelithiasis with associated gallbladder wall thickening and trace free pericholecystic fluid. Clinical correlation for possible acute cholecystitis recommended. 2. Mild dilatation of the common bile duct measuring up to 7.2 mm. Correlation with LFTs recommended. 3. Increased echogenicity within the hepatic parenchyma, nonspecific, but most commonly seen in the setting of steatosis. 4. Few scattered benign-appearing hepatic cysts measuring up to 1.1 cm as above.   Electronically Signed   By: Rise Mu M.D.   On: 11/12/2020 18: Assessment:      Acute cholecystitis afib with RVR  Plan:      Discussed the risk of surgery including post-op infxn, seroma, biloma, chronic pain, poor-delayed wound healing, retained gallstone, conversion to open procedure, post-op SBO or ileus, and need for additional procedures to address said risks.  The risks of general anesthetic including MI, CVA, sudden death or even reaction to anesthetic medications also discussed. Alternatives include continued observation.  Benefits include possible symptom relief, prevention of complications including acute cholecystitis, pancreatitis.  Typical post operative recovery of 3-5 days rest, continued pain in area and incision sites, possible loose stools up to 4-6 weeks, also discussed.  The patient understands the risks, any and all questions were answered to the patient's satisfaction.  IV abx, NPO,  to OR in am.  Hopefully give time for afib rate to be better controlled at that time.  Will discuss with anesthesia possibly moving forward vs abx management vs possible chole tube if afib increases  perioperative risk a significant amount

## 2020-11-13 ENCOUNTER — Inpatient Hospital Stay: Payer: 59 | Admitting: Anesthesiology

## 2020-11-13 ENCOUNTER — Other Ambulatory Visit: Payer: Self-pay

## 2020-11-13 ENCOUNTER — Encounter
Admission: EM | Disposition: A | Discharge status: Home / Self Care | Payer: Self-pay | Source: Home / Self Care | Attending: Internal Medicine

## 2020-11-13 LAB — TSH: TSH: 0.623 u[IU]/mL (ref 0.350–4.500)

## 2020-11-13 LAB — CBC WITH DIFFERENTIAL/PLATELET
Abs Immature Granulocytes: 0.03 10*3/uL (ref 0.00–0.07)
Basophils Absolute: 0.1 10*3/uL (ref 0.0–0.1)
Basophils Relative: 0 %
Eosinophils Absolute: 0 10*3/uL (ref 0.0–0.5)
Eosinophils Relative: 0 %
HCT: 36.8 % (ref 36.0–46.0)
Hemoglobin: 11.3 g/dL — ABNORMAL LOW (ref 12.0–15.0)
Immature Granulocytes: 0 %
Lymphocytes Relative: 15 %
Lymphs Abs: 2 10*3/uL (ref 0.7–4.0)
MCH: 23.6 pg — ABNORMAL LOW (ref 26.0–34.0)
MCHC: 30.7 g/dL (ref 30.0–36.0)
MCV: 76.8 fL — ABNORMAL LOW (ref 80.0–100.0)
Monocytes Absolute: 0.9 10*3/uL (ref 0.1–1.0)
Monocytes Relative: 6 %
Neutro Abs: 10.3 10*3/uL — ABNORMAL HIGH (ref 1.7–7.7)
Neutrophils Relative %: 79 %
Platelets: 281 10*3/uL (ref 150–400)
RBC: 4.79 MIL/uL (ref 3.87–5.11)
RDW: 14.9 % (ref 11.5–15.5)
WBC: 13.3 10*3/uL — ABNORMAL HIGH (ref 4.0–10.5)
nRBC: 0 % (ref 0.0–0.2)

## 2020-11-13 LAB — COMPREHENSIVE METABOLIC PANEL
ALT: 26 U/L (ref 0–44)
AST: 35 U/L (ref 15–41)
Albumin: 3.5 g/dL (ref 3.5–5.0)
Alkaline Phosphatase: 66 U/L (ref 38–126)
Anion gap: 9 (ref 5–15)
BUN: 6 mg/dL (ref 6–20)
CO2: 28 mmol/L (ref 22–32)
Calcium: 8.3 mg/dL — ABNORMAL LOW (ref 8.9–10.3)
Chloride: 97 mmol/L — ABNORMAL LOW (ref 98–111)
Creatinine, Ser: 0.77 mg/dL (ref 0.44–1.00)
GFR, Estimated: >60 mL/min (ref >60)
Glucose, Bld: 122 mg/dL — ABNORMAL HIGH (ref 70–99)
Potassium: 3.5 mmol/L (ref 3.5–5.1)
Sodium: 134 mmol/L — ABNORMAL LOW (ref 135–145)
Total Bilirubin: 0.7 mg/dL (ref 0.3–1.2)
Total Protein: 7.4 g/dL (ref 6.5–8.1)

## 2020-11-13 LAB — PROTIME-INR
INR: 1.1 (ref 0.8–1.2)
Prothrombin Time: 14.1 seconds (ref 11.4–15.2)

## 2020-11-13 LAB — ETHANOL: Alcohol, Ethyl (B): <10 mg/dL (ref <10)

## 2020-11-13 LAB — MAGNESIUM: Magnesium: 1.8 mg/dL (ref 1.7–2.4)

## 2020-11-13 LAB — HIV ANTIBODY (ROUTINE TESTING W REFLEX): HIV Screen 4th Generation wRfx: NONREACTIVE

## 2020-11-13 SURGERY — CHOLECYSTECTOMY, ROBOT-ASSISTED, LAPAROSCOPIC
Anesthesia: General

## 2020-11-13 MED ORDER — PROPOFOL 10 MG/ML IV BOLUS
INTRAVENOUS | Status: DC | PRN
  Administered 2020-11-13: 200 mg via INTRAVENOUS

## 2020-11-13 MED ORDER — PHENYLEPHRINE HCL (PRESSORS) 10 MG/ML IV SOLN
INTRAVENOUS | Status: DC | PRN
  Administered 2020-11-13: 100 ug via INTRAVENOUS
  Administered 2020-11-13: 50 ug via INTRAVENOUS
  Administered 2020-11-13 (×2): 100 ug via INTRAVENOUS

## 2020-11-13 MED ORDER — SUGAMMADEX SODIUM 500 MG/5ML IV SOLN
INTRAVENOUS | Status: DC | PRN
  Administered 2020-11-13: 250 mg via INTRAVENOUS
  Administered 2020-11-13: 150 mg via INTRAVENOUS

## 2020-11-13 MED ORDER — METOPROLOL TARTRATE 50 MG PO TABS: 25.0000 mg | ORAL_TABLET | Freq: Once | ORAL | Status: DC

## 2020-11-13 MED ORDER — ENOXAPARIN SODIUM 40 MG/0.4ML ~~LOC~~ SOLN
40.0000 mg | SUBCUTANEOUS | Status: DC
  Administered 2020-11-14: 09:00:00 40 mg via SUBCUTANEOUS
  Filled 2020-11-13: qty 0.4

## 2020-11-13 MED ORDER — FLUMAZENIL 0.5 MG/5ML IV SOLN
INTRAVENOUS | Status: DC | PRN
  Administered 2020-11-13: .2 mg via INTRAVENOUS

## 2020-11-13 MED ORDER — ONDANSETRON HCL 4 MG/2ML IJ SOLN
INTRAMUSCULAR | Status: DC | PRN
  Administered 2020-11-13: 4 mg via INTRAVENOUS

## 2020-11-13 MED ORDER — FENTANYL CITRATE (PF) 100 MCG/2ML IJ SOLN
INTRAMUSCULAR | Status: AC
  Filled 2020-11-13: qty 2

## 2020-11-13 MED ORDER — DEXAMETHASONE SODIUM PHOSPHATE 10 MG/ML IJ SOLN
INTRAMUSCULAR | Status: DC | PRN
  Administered 2020-11-13: 8 mg via INTRAVENOUS

## 2020-11-13 MED ORDER — FLUMAZENIL 0.5 MG/5ML IV SOLN
INTRAVENOUS | Status: AC
  Filled 2020-11-13: qty 5

## 2020-11-13 MED ORDER — METOPROLOL TARTRATE 5 MG/5ML IV SOLN
INTRAVENOUS | Status: DC | PRN
  Administered 2020-11-13 (×2): 2.5 mg via INTRAVENOUS

## 2020-11-13 MED ORDER — ROCURONIUM BROMIDE 10 MG/ML (PF) SYRINGE
PREFILLED_SYRINGE | INTRAVENOUS | Status: AC
  Filled 2020-11-13: qty 10

## 2020-11-13 MED ORDER — LIDOCAINE-EPINEPHRINE (PF) 1 %-1:200000 IJ SOLN
INTRAMUSCULAR | Status: DC | PRN
  Administered 2020-11-13: 30 mL

## 2020-11-13 MED ORDER — FENTANYL CITRATE (PF) 100 MCG/2ML IJ SOLN: 25.0000 ug | INTRAMUSCULAR | Status: DC | PRN

## 2020-11-13 MED ORDER — ACETAMINOPHEN 10 MG/ML IV SOLN
INTRAVENOUS | Status: DC | PRN
  Administered 2020-11-13: 1000 mg via INTRAVENOUS

## 2020-11-13 MED ORDER — OXYCODONE HCL 5 MG PO TABS: 5.0000 mg | ORAL_TABLET | Freq: Once | ORAL | Status: DC | PRN

## 2020-11-13 MED ORDER — LIDOCAINE HCL (CARDIAC) PF 100 MG/5ML IV SOSY
PREFILLED_SYRINGE | INTRAVENOUS | Status: DC | PRN
  Administered 2020-11-13: 100 mg via INTRAVENOUS

## 2020-11-13 MED ORDER — SUCCINYLCHOLINE CHLORIDE 200 MG/10ML IV SOSY
PREFILLED_SYRINGE | INTRAVENOUS | Status: AC
  Filled 2020-11-13: qty 10

## 2020-11-13 MED ORDER — MIDAZOLAM HCL 2 MG/2ML IJ SOLN
INTRAMUSCULAR | Status: AC
  Filled 2020-11-13: qty 2

## 2020-11-13 MED ORDER — MIDAZOLAM HCL 2 MG/2ML IJ SOLN
INTRAMUSCULAR | Status: DC | PRN
  Administered 2020-11-13: 2 mg via INTRAVENOUS

## 2020-11-13 MED ORDER — ONDANSETRON HCL 4 MG/2ML IJ SOLN
INTRAMUSCULAR | Status: AC
  Filled 2020-11-13: qty 2

## 2020-11-13 MED ORDER — PROPOFOL 10 MG/ML IV BOLUS
INTRAVENOUS | Status: AC
  Filled 2020-11-13: qty 20

## 2020-11-13 MED ORDER — ROCURONIUM BROMIDE 100 MG/10ML IV SOLN
INTRAVENOUS | Status: DC | PRN
  Administered 2020-11-13 (×2): 10 mg via INTRAVENOUS
  Administered 2020-11-13: 50 mg via INTRAVENOUS
  Administered 2020-11-13: 20 mg via INTRAVENOUS

## 2020-11-13 MED ORDER — SUCCINYLCHOLINE CHLORIDE 20 MG/ML IJ SOLN
INTRAMUSCULAR | Status: DC | PRN
  Administered 2020-11-13: 200 mg via INTRAVENOUS

## 2020-11-13 MED ORDER — OXYCODONE HCL 5 MG/5ML PO SOLN
5.0000 mg | Freq: Once | ORAL | Status: DC | PRN
Start: 2020-11-13 — End: 2020-11-13

## 2020-11-13 MED ORDER — POTASSIUM CHLORIDE 10 MEQ/100ML IV SOLN
10.0000 meq | INTRAVENOUS | Status: AC
Start: 2020-11-13 — End: 2020-11-13

## 2020-11-13 MED ORDER — INDOCYANINE GREEN 25 MG IV SOLR
1.2500 mg | Freq: Once | INTRAVENOUS | Status: AC
  Administered 2020-11-13: 1.25 mg via INTRAVENOUS
  Filled 2020-11-13: qty 10

## 2020-11-13 MED ORDER — FENTANYL CITRATE (PF) 100 MCG/2ML IJ SOLN
INTRAMUSCULAR | Status: DC | PRN
  Administered 2020-11-13: 50 ug via INTRAVENOUS
  Administered 2020-11-13: 100 ug via INTRAVENOUS

## 2020-11-13 MED ORDER — LIDOCAINE HCL (PF) 2 % IJ SOLN
INTRAMUSCULAR | Status: AC
  Filled 2020-11-13: qty 5

## 2020-11-13 MED ORDER — DEXAMETHASONE SODIUM PHOSPHATE 10 MG/ML IJ SOLN
INTRAMUSCULAR | Status: AC
  Filled 2020-11-13: qty 1

## 2020-11-13 MED ORDER — DEXMEDETOMIDINE (PRECEDEX) IN NS 20 MCG/5ML (4 MCG/ML) IV SYRINGE
PREFILLED_SYRINGE | INTRAVENOUS | Status: DC | PRN
  Administered 2020-11-13: 4 ug via INTRAVENOUS
  Administered 2020-11-13 (×2): 2 ug via INTRAVENOUS

## 2020-11-13 MED ORDER — BUPIVACAINE HCL (PF) 0.5 % IJ SOLN
INTRAMUSCULAR | Status: DC | PRN
  Administered 2020-11-13: 30 mL

## 2020-11-13 SURGICAL SUPPLY — 55 items
ANCHOR TIS RET SYS 235ML (MISCELLANEOUS) ×3 IMPLANT
BAG INFUSER PRESSURE 100CC (MISCELLANEOUS) IMPLANT
BLADE SURG SZ11 CARB STEEL (BLADE) ×3 IMPLANT
CANNULA REDUC XI 12-8 STAPL (CANNULA) ×1
CANNULA REDUC XI 12-8MM STAPL (CANNULA) ×1
CANNULA REDUCER 12-8 DVNC XI (CANNULA) ×1 IMPLANT
CATH REDDICK CHOLANGI 4FR 50CM (CATHETERS) IMPLANT
CHLORAPREP W/TINT 26 (MISCELLANEOUS) ×3 IMPLANT
CLIP VESOLOCK MED LG 6/CT (CLIP) ×3 IMPLANT
COVER TIP SHEARS 8 DVNC (MISCELLANEOUS) ×1 IMPLANT
COVER TIP SHEARS 8MM DA VINCI (MISCELLANEOUS) ×2
COVER WAND RF STERILE (DRAPES) ×3 IMPLANT
DECANTER SPIKE VIAL GLASS SM (MISCELLANEOUS) ×3 IMPLANT
DEFOGGER SCOPE WARMER CLEARIFY (MISCELLANEOUS) ×3 IMPLANT
DERMABOND ADVANCED (GAUZE/BANDAGES/DRESSINGS) ×2
DERMABOND ADVANCED .7 DNX12 (GAUZE/BANDAGES/DRESSINGS) ×1 IMPLANT
DRAPE ARM DVNC X/XI (DISPOSABLE) ×4 IMPLANT
DRAPE C-ARM XRAY 36X54 (DRAPES) IMPLANT
DRAPE COLUMN DVNC XI (DISPOSABLE) ×1 IMPLANT
DRAPE DA VINCI XI ARM (DISPOSABLE) ×8
DRAPE DA VINCI XI COLUMN (DISPOSABLE) ×2
ELECT CAUTERY BLADE 6.4 (BLADE) ×3 IMPLANT
ELECT REM PT RETURN 9FT ADLT (ELECTROSURGICAL) ×3
ELECTRODE REM PT RTRN 9FT ADLT (ELECTROSURGICAL) ×1 IMPLANT
GLOVE BIOGEL PI IND STRL 7.0 (GLOVE) ×2 IMPLANT
GLOVE BIOGEL PI INDICATOR 7.0 (GLOVE) ×4
GLOVE SURG SYN 6.5 ES PF (GLOVE) ×6 IMPLANT
GOWN STRL REUS W/ TWL LRG LVL3 (GOWN DISPOSABLE) ×3 IMPLANT
GOWN STRL REUS W/TWL LRG LVL3 (GOWN DISPOSABLE) ×6
GRASPER SUT TROCAR 14GX15 (MISCELLANEOUS) IMPLANT
IRRIGATOR SUCT 8 DISP DVNC XI (IRRIGATION / IRRIGATOR) ×1 IMPLANT
IRRIGATOR SUCTION 8MM XI DISP (IRRIGATION / IRRIGATOR) ×2
IV NS 1000ML (IV SOLUTION)
IV NS 1000ML BAXH (IV SOLUTION) IMPLANT
LABEL OR SOLS (LABEL) ×3 IMPLANT
MANIFOLD NEPTUNE II (INSTRUMENTS) IMPLANT
NEEDLE HYPO 22GX1.5 SAFETY (NEEDLE) ×3 IMPLANT
NEEDLE INSUFFLATION 14GA 120MM (NEEDLE) ×3 IMPLANT
NS IRRIG 500ML POUR BTL (IV SOLUTION) ×3 IMPLANT
OBTURATOR OPTICAL STANDARD 8MM (TROCAR) ×2
OBTURATOR OPTICAL STND 8 DVNC (TROCAR) ×1
OBTURATOR OPTICALSTD 8 DVNC (TROCAR) ×1 IMPLANT
PACK LAP CHOLECYSTECTOMY (MISCELLANEOUS) ×3 IMPLANT
PENCIL ELECTRO HAND CTR (MISCELLANEOUS) ×3 IMPLANT
SEAL CANN UNIV 5-8 DVNC XI (MISCELLANEOUS) ×3 IMPLANT
SEAL XI 5MM-8MM UNIVERSAL (MISCELLANEOUS) ×6
SET TUBE SMOKE EVAC HIGH FLOW (TUBING) ×3 IMPLANT
SOLUTION ELECTROLUBE (MISCELLANEOUS) ×3 IMPLANT
STAPLER CANNULA SEAL DVNC XI (STAPLE) ×1 IMPLANT
STAPLER CANNULA SEAL XI (STAPLE) ×2
SUT MNCRL 4-0 (SUTURE) ×4
SUT MNCRL 4-0 27XMFL (SUTURE) ×2
SUT VICRYL 0 AB UR-6 (SUTURE) ×3 IMPLANT
SUTURE MNCRL 4-0 27XMF (SUTURE) ×2 IMPLANT
SYR 30ML LL (SYRINGE) IMPLANT

## 2020-11-13 NOTE — Progress Notes (Addendum)
PROGRESS NOTE    Emileigh Trinka  QPY:[id number] DOB: [birth year] DOA: 11/12/2020 PCP: Patient, No Pcp Per    Brief Narrative:  Dior West is a 34 y.o. female with medical history significant for mild intermittent asthma who is admitted to [facility] on 11/12/2020 with atrial fibrillation with RVR after presenting from home to [facility] Emergency Department complaining of palpitations.   In A. fib RVR.  Also was complaining of nausea and nonbloody nonbilious emesis.  Found with acute cholecystitis with sepsis  This a.m. patient is in sinus rhythm weaned off of Cardizem drip plan to go to the OR  Consultants:   General surgery  Procedures:   Antimicrobials:   zosyn    Subjective: Feeling better this am, has no n/v now.  Objective: Vitals:   11/13/20 0730 11/13/20 0745 11/13/20 0800 11/13/20 0815  BP: 140/83  (!) 111/54   Pulse:      Resp: 18 (!) 21 17 (!) 24  Temp:      TempSrc:      SpO2:        Intake/Output Summary (Last 24 hours) at 11/13/2020 1245 Last data filed at 11/12/2020 2116 Gross per 24 hour  Intake 1037.34 ml  Output --  Net 1037.34 ml   There were no vitals filed for this visit.  Examination:  General exam: Appears calm and comfortable  Respiratory system: Clear to auscultation. Respiratory effort normal. Cardiovascular system: S1 & S2 heard, RRR. No JVD, murmurs, rubs, gallops or clicks.  Gastrointestinal system: Abdomen is nondistended, soft and nontender.  Normal bowel sounds heard. Central nervous system: Alert and oriented. No focal neurological deficits. Extremities: no edema Skin: warm, dry Psychiatry: Judgement and insight appear normal. Mood & affect appropriate.     Data Reviewed: I have personally reviewed following labs and imaging studies  CBC: Recent Labs  Lab 11/12/20 1545 11/13/20 0343  WBC 14.6* 13.3*  NEUTROABS 12.3* 10.3*  HGB 13.0 11.3*  HCT 42.2 36.8  MCV 75.8* 76.8*  PLT 290 281   Basic  Metabolic Panel: Recent Labs  Lab 11/12/20 1545 11/13/20 0343  NA 136 134*  K 3.6 3.5  CL 99 97*  CO2 23 28  GLUCOSE 116* 122*  BUN 8 6  CREATININE 0.80 0.77  CALCIUM 9.1 8.3*  MG 1.8 1.8   GFR: CrCl cannot be calculated (Unknown ideal weight.). Liver Function Tests: Recent Labs  Lab 11/12/20 1545 11/13/20 0343  AST 30 35  ALT 19 26  ALKPHOS 70 66  BILITOT 0.8 0.7  PROT 8.8* 7.4  ALBUMIN 4.2 3.5   Recent Labs  Lab 11/12/20 1545  LIPASE 24   No results for input(s): AMMONIA in the last 168 hours. Coagulation Profile: Recent Labs  Lab 11/13/20 0343  INR 1.1   Cardiac Enzymes: No results for input(s): CKTOTAL, CKMB, CKMBINDEX, TROPONINI in the last 168 hours. BNP (last 3 results) No results for input(s): PROBNP in the last 8760 hours. HbA1C: No results for input(s): HGBA1C in the last 72 hours. CBG: No results for input(s): GLUCAP in the last 168 hours. Lipid Profile: No results for input(s): CHOL, HDL, LDLCALC, TRIG, CHOLHDL, LDLDIRECT in the last 72 hours. Thyroid Function Tests: Recent Labs    11/13/20 0343  TSH 0.623   Anemia Panel: No results for input(s): VITAMINB12, FOLATE, FERRITIN, TIBC, IRON, RETICCTPCT in the last 72 hours. Sepsis Labs: Recent Labs  Lab 11/12/20 2037 11/12/20 2125  LATICACIDVEN 1.8 1.5    Recent Results (from the  past 240 hour(s))  Resp Panel by RT-PCR (Flu A&B, Covid) Nasopharyngeal Swab     Status: None   Collection Time: 11/12/20  3:45 PM   Specimen: Nasopharyngeal Swab; Nasopharyngeal(NP) swabs in vial transport medium  Result Value Ref Range Status   SARS Coronavirus 2 by RT PCR NEGATIVE NEGATIVE Final    Comment: (NOTE) SARS-CoV-2 target nucleic acids are NOT DETECTED.  The SARS-CoV-2 RNA is generally detectable in upper respiratory specimens during the acute phase of infection. The lowest concentration of SARS-CoV-2 viral copies this assay can detect is 138 copies/mL. A negative result does not preclude  SARS-Cov-2 infection and should not be used as the sole basis for treatment or other patient management decisions. A negative result may occur with  improper specimen collection/handling, submission of specimen other than nasopharyngeal swab, presence of viral mutation(s) within the areas targeted by this assay, and inadequate number of viral copies(<138 copies/mL). A negative result must be combined with clinical observations, patient history, and epidemiological information. The expected result is Negative.  Fact Sheet for Patients:  BloggerCourse.com  Fact Sheet for Healthcare Providers:  SeriousBroker.it  This test is no t yet approved or cleared by the [place] FDA and  has been authorized for detection and/or diagnosis of SARS-CoV-2 by FDA under an Emergency Use Authorization (EUA). This EUA will remain  in effect (meaning this test can be used) for the duration of the COVID-19 declaration under Section 564(b)(1) of the Act, 21 U.S.C.section 360bbb-3(b)(1), unless the authorization is terminated  or revoked sooner.       Influenza A by PCR NEGATIVE NEGATIVE Final   Influenza B by PCR NEGATIVE NEGATIVE Final    Comment: (NOTE) The Xpert Xpress SARS-CoV-2/FLU/RSV plus assay is intended as an aid in the diagnosis of influenza from Nasopharyngeal swab specimens and should not be used as a sole basis for treatment. Nasal washings and aspirates are unacceptable for Xpert Xpress SARS-CoV-2/FLU/RSV testing.  Fact Sheet for Patients: BloggerCourse.com  Fact Sheet for Healthcare Providers: SeriousBroker.it  This test is not yet approved or cleared by the [place] FDA and has been authorized for detection and/or diagnosis of SARS-CoV-2 by FDA under an Emergency Use Authorization (EUA). This EUA will remain in effect (meaning this test can be used) for the duration of  the COVID-19 declaration under Section 564(b)(1) of the Act, 21 U.S.C. section 360bbb-3(b)(1), unless the authorization is terminated or revoked.  Performed at [facility], [address], [place], [place] [address]          Radiology Studies: DG Chest Portable 1 View  Result Date: 11/12/2020 CLINICAL DATA:  new onset Afib, eval infiltrate EXAM: PORTABLE CHEST 1 VIEW COMPARISON:  None. FINDINGS: No focal consolidation. No pneumothorax or pleural effusion. Cardiomediastinal silhouette is within normal limits. No acute osseous abnormality. IMPRESSION: No focal airspace disease. Electronically Signed   By: Stana Bunting M.D.   On: 11/12/2020 16:12   US Abdomen Limited RUQ (LIVER/GB)  Result Date: 11/12/2020 CLINICAL DATA:  Initial evaluation for acute epigastric pain, vomiting. EXAM: ULTRASOUND ABDOMEN LIMITED RIGHT UPPER QUADRANT COMPARISON:  None. FINDINGS: Gallbladder: Multiple shadowing echogenic stones seen within the gallbladder lumen, largest of which measures up to 1.6 cm. Gallbladder wall thickened and edematous in appearance measuring up to 10-11 mm. Probable trace free pericholecystic fluid. No sonographic Murphy sign elicited on exam. Common bile duct: Diameter: 7.2 mm Liver: 1.1 x 0.8 x 0.8 cm benign appearing cyst noted within the left hepatic lobe. 1.0 x  1.0 x 1.0 cm cyst noted within the right hepatic lobe. Additional right hepatic lobe cysts measure 0.9 x 1.0 x 0.8 cm. Increased echogenicity within the underlying hepatic parenchyma. Portal vein is patent on color Doppler imaging with normal direction of blood flow towards the liver. Other: None. IMPRESSION: 1. Cholelithiasis with associated gallbladder wall thickening and trace free pericholecystic fluid. Clinical correlation for possible acute cholecystitis recommended. 2. Mild dilatation of the common bile duct measuring up to 7.2 mm. Correlation with LFTs recommended. 3. Increased echogenicity within the hepatic  parenchyma, nonspecific, but most commonly seen in the setting of steatosis. 4. Few scattered benign-appearing hepatic cysts measuring up to 1.1 cm as above. Electronically Signed   By: Rise Mu M.D.   On: 11/12/2020 18:42        Scheduled Meds: Continuous Infusions: . diltiazem (CARDIZEM) infusion 15 mg/hr (11/13/20 0205)  . lactated ringers Stopped (11/13/20 0405)  . piperacillin-tazobactam (ZOSYN)  IV Stopped (11/13/20 0825)    Assessment & Plan:   Principal Problem:   Atrial fibrillation with RVR (HCC) Active Problems:   Acute cholecystitis   Sepsis (HCC)   Epigastric pain   Mild intermittent asthma   1. Afib rvr- new likely in setting of infection.  Needs to have sleep study as outpt as it can also cause afib. Chadsvasc 1, no need for a/c.  Discussed abstaining from caffine Echo pending Now in sr, weaned off cardizem gtt. Will start low dose cardizem if bp allows post surger   2. Sepsis -2/2 acute cholecystitis- hemodynamically stable. Continue ivf Zosyn iv continue OR today  3. Acute cholecysitits-  Going to OR today  continue iv zosyn Primary mx per surgery  4.Mild intermittent asthma: -without acute exacerbation Continue MDI   DVT prophylaxis: scd Code Status:full Family Communication: none at bedside  Status is: Inpatient  Remains inpatient appropriate because:Inpatient level of care appropriate due to severity of illness   Dispo: The patient is from: Home              Anticipated d/c is to: Home              Anticipated d/c date is: 2 days              Patient currently is not medically stable to d/c. going to OR, needs ivabx. When cleared by surgery            LOS: 1 day   Time spent: 35 min with >50% ON COC    Lynn Ito, MD [facility] Pager [phone]  If 7PM-7AM, please contact night-coverage 11/13/2020, 8:33 AM

## 2020-11-13 NOTE — Transfer of Care (Signed)
Immediate Anesthesia Transfer of Care Note  Patient: Gail Wilkinson  Procedure(s) Performed: XI ROBOTIC ASSISTED LAPAROSCOPIC CHOLECYSTECTOMY (N/A )  Patient Location: PACU  Anesthesia Type:General  Level of Consciousness: drowsy  Airway & Oxygen Therapy: Patient Spontanous Breathing and Patient connected to face mask oxygen  Post-op Assessment: Report given to RN and Post -op Vital signs reviewed and stable  Post vital signs: Reviewed and stable  Last Vitals:  Vitals Value Taken Time  BP 113/69   Temp    Pulse 82   Resp 10   SpO2 97     Last Pain:  Vitals:   11/13/20 1340  TempSrc: Tympanic  PainSc: 0-No pain         Complications: No complications documented.

## 2020-11-13 NOTE — ED Notes (Signed)
Patient denies pain and is resting comfortably.  

## 2020-11-13 NOTE — Progress Notes (Signed)
Subjective:  CC: Gail Wilkinson is a 34 y.o. female  Hospital stay day 1,   acute cholecystitis, afib with RVR  HPI: No issues overnight.  ROS:  General: Denies weight loss, weight gain, fatigue, fevers, chills, and night sweats. Heart: Denies chest pain, palpitations, racing heart, irregular heartbeat, leg pain or swelling, and decreased activity tolerance. Respiratory: Denies breathing difficulty, shortness of breath, wheezing, cough, and sputum. GI: Denies change in appetite, heartburn, nausea, vomiting, constipation, diarrhea, and blood in stool. GU: Denies difficulty urinating, pain with urinating, urgency, frequency, blood in urine.   Objective:   Temp:  [98.2 F (36.8 C)] 98.2 F (36.8 C) (12/09 1537) Pulse Rate:  [27-167] 102 (12/10 0726) Resp:  [9-32] 24 (12/10 0815) BP: (111-178)/(54-132) 111/54 (12/10 0800) SpO2:  [89 %-100 %] 100 % (12/10 0726)             Intake/Output this shift:   Intake/Output Summary (Last 24 hours) at 11/13/2020 2836 Last data filed at 11/12/2020 2116 Gross per 24 hour  Intake 1037.34 ml  Output --  Net 1037.34 ml    Constitutional :  alert, cooperative, appears stated age and no distress  Respiratory:  clear to auscultation bilaterally  Cardiovascular:  regular rate and rhythm  Gastrointestinal: soft, no guarding, focal TTP in epigastric/RUQ region.   Skin: Cool and moist.   Psychiatric: Normal affect, non-agitated, not confused       LABS:  CMP Latest Ref Rng & Units 11/13/2020 11/12/2020  Glucose 70 - 99 mg/dL 629(U) 765(Y)  BUN 6 - 20 mg/dL 6 8  Creatinine 6.50 - 1.00 mg/dL 3.54 6.56  Sodium 812 - 145 mmol/L 134(L) 136  Potassium 3.5 - 5.1 mmol/L 3.5 3.6  Chloride 98 - 111 mmol/L 97(L) 99  CO2 22 - 32 mmol/L 28 23  Calcium 8.9 - 10.3 mg/dL 8.3(L) 9.1  Total Protein 6.5 - 8.1 g/dL 7.4 7.5(T)  Total Bilirubin 0.3 - 1.2 mg/dL 0.7 0.8  Alkaline Phos 38 - 126 U/L 66 70  AST 15 - 41 U/L 35 30  ALT 0 - 44 U/L 26 19   CBC  Latest Ref Rng & Units 11/13/2020 11/12/2020  WBC 4.0 - 10.5 K/uL 13.3(H) 14.6(H)  Hemoglobin 12.0 - 15.0 g/dL 11.3(L) 13.0  Hematocrit 36.0 - 46.0 % 36.8 42.2  Platelets 150 - 400 K/uL 281 290    RADS: n/a Assessment:   Acute cholecystitis afib with RVR  afib converted to sinus rhythm now. Plan is to proceed with lap chole later today.  All questions addressed.

## 2020-11-13 NOTE — Consult Note (Addendum)
Cardiology Consult    Patient ID: Gail Wilkinson MRN: [id number], DOB/AGE: 1986-06-24   Admit date: 11/12/2020 Date of Consult: 11/13/2020  Primary Physician: Patient, No Pcp Per Primary Cardiologist: Julien Nordmann, MD - new Requesting Provider: S. Marylu Lund, MD  Patient Profile    Gail Wilkinson is a 34 y.o. female with a history of mild asthma and morbid obesity, who is being seen today for the evaluation of Afib w/ RVR in the setting of acute cholecystitis at the request of Dr. Marylu Lund.  Past Medical History   Past Medical History:  Diagnosis Date  . Mild intermittent asthma   . Morbid obesity (HCC)     History reviewed. No pertinent surgical history.   Allergies  Allergies  Allergen Reactions  . Shellfish Allergy Itching    History of Present Illness    34 year old female without any prior cardiac history.  She does have a history of mild, intermittent asthma and rarely requires a rescue inhaler.  Her last significant asthma attack was in 2018.  She lives locally with her husband and children.  She does not routinely exercise but is able to complete usual household chores, grocery shopping, and activities of daily living without symptoms or limitations.  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  She was in her usual state of health until the morning of December 9, when shortly after awakening, she noted mild epigastric discomfort.  While working from home, she noted that this worsened and radiated through to her back.  She then began feeling lightheaded and nauseated and went to use the bathroom.  She believes she vomited approximately 12-16 times and this was associated with worsening epigastric pain.  She then went upstairs to shower and while in the shower, she noted sudden onset of tachypalpitations.  Due to constellation of symptoms, EMS was called and she was taken to the emergency department just before 3:30 PM on December 9.  There, she was found to be in  atrial fibrillation with rapid ventricular response with a rate of 168 bpm.  Lab work was notable for mild leukocytosis with a white blood cell count of 14.6.  She was placed on IV diltiazem.  Troponin was normal x2.  Lactic acid was normal.  In the setting of epigastric pain, nausea, and vomiting, abdominal ultrasound was performed and showed findings consistent with acute cholecystitis.  She was seen by surgery in the evening of December 9 with recommendation for IV antibiotics and cholecystectomy.  This morning, atrial fibrillation broke at 5:39 AM and she has since been maintaining sinus rhythm.  Blood pressures have been somewhat soft in the 90s to low 100s and she is no longer on IV diltiazem.  She currently reports mild epigastric discomfort but is otherwise comfortable.  Inpatient Medications     Scheduled Meds: Continuous Infusions: . lactated ringers Stopped (11/13/20 0405)  . piperacillin-tazobactam (ZOSYN)  IV 3.375 g (11/13/20 1021)   PRN Meds:.acetaminophen **OR** acetaminophen, albuterol, HYDROmorphone (DILAUDID) injection, naLOXone (NARCAN)  injection, ondansetron (ZOFRAN) IV   Family History    Family History  Problem Relation Age of Onset  . Other Mother        alive and well  . Hypertension Sister   . Metabolic syndrome Sister    She indicated that her mother is alive. She indicated that her sister is alive.   Social History    Social History   Socioeconomic History  . Marital status: Married    Spouse name: Not  on file  . Number of children: Not on file  . Years of education: Not on file  . Highest education level: Not on file  Occupational History  . Occupation: Air cabin crew    Comment: Works from home for [employer])  Tobacco Use  . Smoking status: Never Smoker  . Smokeless tobacco: Never Used  Substance and Sexual Activity  . Alcohol use: Not Currently  . Drug use: Never  . Sexual activity: Not on file  Other Topics Concern  . Not  on file  Social History Narrative   Lives locally with husband and children. Grew up in [place] but has been living in [place] since 2011. Does not routinely exercise.   Social Determinants of Health   Financial Resource Strain: Not on file  Food Insecurity: Not on file  Transportation Needs: Not on file  Physical Activity: Not on file  Stress: Not on file  Social Connections: Not on file  Intimate Partner Violence: Not on file     Review of Systems    General:  No chills, fever, night sweats or weight changes.  Cardiovascular:  No chest pain, dyspnea on exertion, edema, orthopnea, +++ palpitations, no paroxysmal nocturnal dyspnea. Dermatological: No rash, lesions/masses Respiratory: No cough, dyspnea Urologic: No hematuria, dysuria Abdominal:   +++ nausea, +++ vomiting, occas diarrhea, no bright red blood per rectum, melena, or hematemesis Neurologic:  No visual changes, wkns, changes in mental status. All other systems reviewed and are otherwise negative except as noted above.  Physical Exam    Blood pressure 118/76, pulse 94, temperature 98.2 F (36.8 C), temperature source Oral, resp. rate 20, last menstrual period 10/29/2020, SpO2 98 %.  General: Pleasant, NAD Psych: Normal affect. Neuro: Alert and oriented X 3. Moves all extremities spontaneously. HEENT: Normal  Neck: Supple without bruits or JVD. Lungs:  Resp regular and unlabored, CTA. Heart: RRR no s3, s4, or murmurs. Abdomen: Soft, diffusely tender, non-distended, BS + x 4.  Extremities: No clubbing, cyanosis or edema. DP/PT2+, Radials 2+ and equal bilaterally.  Labs    Cardiac Enzymes Recent Labs  Lab 11/12/20 1545  TROPONINIHS 14  14      Lab Results  Component Value Date   WBC 13.3 (H) 11/13/2020   HGB 11.3 (L) 11/13/2020   HCT 36.8 11/13/2020   MCV 76.8 (L) 11/13/2020   PLT 281 11/13/2020    Recent Labs  Lab 11/13/20 0343  NA 134*  K 3.5  CL 97*  CO2 28  BUN 6  CREATININE 0.77  CALCIUM 8.3*   PROT 7.4  BILITOT 0.7  ALKPHOS 66  ALT 26  AST 35  GLUCOSE 122*    Lab Results  Component Value Date   TSH 0.623 11/13/2020    Radiology Studies    DG Chest Portable 1 View  Result Date: 11/12/2020 CLINICAL DATA:  new onset Afib, eval infiltrate EXAM: PORTABLE CHEST 1 VIEW COMPARISON:  None. FINDINGS: No focal consolidation. No pneumothorax or pleural effusion. Cardiomediastinal silhouette is within normal limits. No acute osseous abnormality. IMPRESSION: No focal airspace disease. Electronically Signed   By: Stana Bunting M.D.   On: 11/12/2020 16:12   US Abdomen Limited RUQ (LIVER/GB)  Result Date: 11/12/2020 CLINICAL DATA:  Initial evaluation for acute epigastric pain, vomiting. EXAM: ULTRASOUND ABDOMEN LIMITED RIGHT UPPER QUADRANT COMPARISON:  None. FINDINGS: Gallbladder: Multiple shadowing echogenic stones seen within the gallbladder lumen, largest of which measures up to 1.6 cm. Gallbladder wall thickened and edematous in appearance measuring  up to 10-11 mm. Probable trace free pericholecystic fluid. No sonographic Murphy sign elicited on exam. Common bile duct: Diameter: 7.2 mm Liver: 1.1 x 0.8 x 0.8 cm benign appearing cyst noted within the left hepatic lobe. 1.0 x 1.0 x 1.0 cm cyst noted within the right hepatic lobe. Additional right hepatic lobe cysts measure 0.9 x 1.0 x 0.8 cm. Increased echogenicity within the underlying hepatic parenchyma. Portal vein is patent on color Doppler imaging with normal direction of blood flow towards the liver. Other: None. IMPRESSION: 1. Cholelithiasis with associated gallbladder wall thickening and trace free pericholecystic fluid. Clinical correlation for possible acute cholecystitis recommended. 2. Mild dilatation of the common bile duct measuring up to 7.2 mm. Correlation with LFTs recommended. 3. Increased echogenicity within the hepatic parenchyma, nonspecific, but most commonly seen in the setting of steatosis. 4. Few scattered  benign-appearing hepatic cysts measuring up to 1.1 cm as above. Electronically Signed   By: Rise Mu M.D.   On: 11/12/2020 18:42    ECG & Cardiac Imaging    Atrial fibrillation, 168, nonspecific ST and T changes- personally reviewed.  Assessment & Plan    1.  Acute cholecystitis/preoperative cardiovascular evaluation: Patient without prior cardiac history or any prior significant limitations in activities who presented to the emergency department yesterday with ongoing epigastric pain, nausea, vomiting, and findings of A. fib with RVR and acute cholecystitis.  Pain preceded tachypalpitations.  She is scheduled for surgery this afternoon we have been asked to provide preoperative evaluation.  She notes good activity tolerance at home and perioperative risk of major cardiac event in the setting of noncardiac surgery calculates to less than 0.4%.  In that setting, she is low risk for pending cholecystectomy and may proceed to the OR without further ischemic evaluation.  She converted to sinus rhythm at 5:39 AM this morning.  Diltiazem has been discontinued in the setting of soft blood pressures.  Will give a low dose of metoprolol this AM.  2.  Atrial fibrillation with rapid ventricular response: See #1.  In the setting of epigastric pain, nausea, no vomiting, she developed tachypalpitations and was found to be in atrial fibrillation.  This has since broke and she has been maintaining sinus rhythm since 539 this morning.  CHA2DS2-VASc equals 1 (gender), and in that setting, she will not require long-term anticoagulation.  Lab work relatively unremarkable with normal troponin, normal TSH.  K is on low end of nl @ 3.5 and we will supp.  Follow-up echocardiogram (could be done as outpatient if unable to perform prior to discharge). Will give a low dose of metoprolol this AM.  If she has recurrent atrial fibrillation while hospitalized, could resume IV diltiazem versus bolus dosing of IV metoprolol.   May benefit from a prescription for diltiazem 30 mg to be used as needed for recurrent tachypalpitations in the outpatient setting.  3.  History of asthma: Rarely uses albuterol.  No active wheezing.  4.  Mild hypokalemia:  supp.  Signed, Nicolasa Ducking, NP 11/13/2020, 12:13 PM  For questions or updates, please contact   Please consult [email or web address] for contact info under Cardiology/STEMI.

## 2020-11-13 NOTE — Op Note (Signed)
Preoperative diagnosis:  acute and cholecystitis  Postoperative diagnosis: same as above  Procedure: Robotic assisted Laparoscopic Cholecystectomy.   Anesthesia: GETA   Surgeon: Sung Amabile  Specimen: Gallbladder  Complications: None  EBL: 30mL  Wound Classification: Clean Contaminated  Indications: see HPI  Findings: Critical view of safety noted Cystic duct and artery identified, ligated and divided, clips remained intact at end of procedure Adequate hemostasis  Description of procedure:  The patient was placed on the operating table in the supine position. SCDs placed, pre-op abx administered.  General anesthesia was induced and OG tube placed by anesthesia. A time-out was completed verifying correct patient, procedure, site, positioning, and implant(s) and/or special equipment prior to beginning this procedure. The abdomen was prepped and draped in the usual sterile fashion.    Veress needle was placed at the Palmer's point and insufflation was started after confirming a positive saline drop test and no immediate increase in abdominal pressure.  After reaching 15 mm, the Veress needle was removed and a 8 mm port was placed via optiview technique under umbilicus measured 76mm from gallbladder.  The abdomen was inspected and no abnormalities or injuries were found.  Under direct vision, ports were placed in the following locations: One 12 mm patient left of the umbilicus, 8cm from the optiviewed port, one 8 mm port placed to the patient right of the umbilical port 8 cm apart.  1 additional 8 mm port placed lateral to the 33mm port.  Once ports were placed, The table was placed in the reverse Trendelenburg position with the right side up. The Xi platform was brought into the operative field and docked to the ports successfully.  An endoscope was placed through the umbilical port, fenestrated grasper through the adjacent patient right port, prograsp to the far patient left port, and then  a hook cautery in the left port.  After the distended gallbladder was decompressed with needle, the dome of the gallbladder was grasped with prograsp, passed and retracted over the dome of the liver. Adhesions between the gallbladder and omentum, duodenum and transverse colon were lysed via hook cautery. The infundibulum was grasped with the fenestrated grasper and retracted toward the right lower quadrant. This maneuver exposed Calot's triangle. The edmatous and extremely thick peritoneum overlying the gallbladder infundibulum was then dissected using combination of [place] dissector and electrocautery hook and the cystic duct and cystic artery identified.  Critical view of safety with the liver bed clearly visible behind the duct and artery with no additional structures noted.  The cystic duct and cystic artery clipped and divided close to the gallbladder.     The gallbladder was then dissected from its peritoneal and liver bed attachments by electrocautery. Hemostasis was checked prior to removing the hook cautery and the Endo Catch bag was then placed through the 12 mm port.  Incision had to be extended and the gallbladder was removed.  The gallbladder was passed off the table as a specimen. There was no evidence of bleeding from the gallbladder fossa or cystic artery or leakage of the bile from the cystic duct stump. The 12 mm port site then closed with 0 vicryl under direct visualization, approximating the anterior fascia.  The posterior was then closed with PMI using 0 vicryl under direct vision after re insufflating the abdominal cavity.  Abdomen desufflated and secondary trocars were removed under direct vision. No bleeding was noted. All skin incisions then closed with subcuticular sutures of 4-0 monocryl and dressed with topical skin adhesive.  The orogastric tube was removed and patient extubated.  The patient tolerated the procedure well and was taken to the postanesthesia care unit in stable  condition.  All sponge and instrument count correct at end of procedure.

## 2020-11-13 NOTE — Anesthesia Procedure Notes (Signed)
Procedure Name: Intubation Date/Time: 11/13/2020 2:11 PM Performed by: Jaye Beagle, CRNA Pre-anesthesia Checklist: Patient identified, Emergency Drugs available, Suction available and Patient being monitored Patient Re-evaluated:Patient Re-evaluated prior to induction Oxygen Delivery Method: Circle system utilized Preoxygenation: Pre-oxygenation with 100% oxygen Induction Type: IV induction and Rapid sequence Ventilation: Mask ventilation without difficulty Laryngoscope Size: McGraph and 3 Grade View: Grade II Tube type: Oral Tube size: 7.0 mm Number of attempts: 1 Airway Equipment and Method: Stylet and Oral airway Placement Confirmation: ETT inserted through vocal cords under direct vision,  positive ETCO2 and breath sounds checked- equal and bilateral Secured at: 21 cm Tube secured with: Tape Dental Injury: Teeth and Oropharynx as per pre-operative assessment  Comments: Large amount redundant tissue/? enlarged adenoids.

## 2020-11-13 NOTE — Anesthesia Preprocedure Evaluation (Signed)
Anesthesia Evaluation  Patient identified by MRN, date of birth, ID band Patient awake    Reviewed: Allergy & Precautions, H&P , NPO status , Patient's Chart, lab work & pertinent test results  History of Anesthesia Complications Negative for: history of anesthetic complications  Airway Mallampati: III  TM Distance: >3 FB Neck ROM: full    Dental  (+) Chipped   Pulmonary neg shortness of breath, asthma ,    Pulmonary exam normal        Cardiovascular Exercise Tolerance: Good (-) angina(-) DOE + dysrhythmias Atrial Fibrillation      Neuro/Psych negative neurological ROS  negative psych ROS   GI/Hepatic negative GI ROS, Neg liver ROS, neg GERD  ,  Endo/Other  negative endocrine ROS  Renal/GU      Musculoskeletal   Abdominal   Peds  Hematology negative hematology ROS (+)   Anesthesia Other Findings Patient has cardiac clearance for this procedure.   Past Medical History: No date: Mild intermittent asthma No date: Morbid obesity (HCC)  History reviewed. No pertinent surgical history.     Reproductive/Obstetrics negative OB ROS                             Anesthesia Physical Anesthesia Plan  ASA: III  Anesthesia Plan: General ETT   Post-op Pain Management:    Induction: Intravenous  PONV Risk Score and Plan: Ondansetron, Dexamethasone, Midazolam and Treatment may vary due to age or medical condition  Airway Management Planned: Oral ETT  Additional Equipment:   Intra-op Plan:   Post-operative Plan: Extubation in OR  Informed Consent: I have reviewed the patients History and Physical, chart, labs and discussed the procedure including the risks, benefits and alternatives for the proposed anesthesia with the patient or authorized representative who has indicated his/her understanding and acceptance.     Dental Advisory Given  Plan Discussed with: Anesthesiologist, CRNA  and Surgeon  Anesthesia Plan Comments: (Patient consented for risks of anesthesia including but not limited to:  - adverse reactions to medications - damage to eyes, teeth, lips or other oral mucosa - nerve damage due to positioning  - sore throat or hoarseness - Damage to heart, brain, nerves, lungs, other parts of body or loss of life  Patient voiced understanding.)        Anesthesia Quick Evaluation

## 2020-11-14 LAB — CBC
HCT: 33.3 % — ABNORMAL LOW (ref 36.0–46.0)
Hemoglobin: 10.2 g/dL — ABNORMAL LOW (ref 12.0–15.0)
MCH: 23.7 pg — ABNORMAL LOW (ref 26.0–34.0)
MCHC: 30.6 g/dL (ref 30.0–36.0)
MCV: 77.4 fL — ABNORMAL LOW (ref 80.0–100.0)
Platelets: 256 10*3/uL (ref 150–400)
RBC: 4.3 MIL/uL (ref 3.87–5.11)
RDW: 15.5 % (ref 11.5–15.5)
WBC: 12 10*3/uL — ABNORMAL HIGH (ref 4.0–10.5)
nRBC: 0 % (ref 0.0–0.2)

## 2020-11-14 MED ORDER — HYDROCODONE-ACETAMINOPHEN 5-325 MG PO TABS: 1.0000 | ORAL_TABLET | ORAL | 0 refills | Status: AC | PRN

## 2020-11-14 NOTE — Progress Notes (Addendum)
Progress Note  Patient Name: Gail Wilkinson Date of Encounter: 11/14/2020  Primary Cardiologist: Julien Nordmann, MD  Subjective   Feels well this AM.  Mild abd discomfort.  No recurrent afib.  Inpatient Medications    Scheduled Meds: . enoxaparin (LOVENOX) injection  40 mg Subcutaneous Q24H  . metoprolol tartrate  25 mg Oral Once   Continuous Infusions: . lactated ringers 75 mL/hr at 11/14/20 0642   PRN Meds: acetaminophen **OR** acetaminophen, albuterol, HYDROmorphone (DILAUDID) injection, naLOXone (NARCAN)  injection, ondansetron (ZOFRAN) IV   Vital Signs    Vitals:   11/14/20 0500 11/14/20 0530 11/14/20 0600 11/14/20 0700  BP:      Pulse: 85 84 84 81  Resp: 15 18 (!) 21 17  Temp:      TempSrc:      SpO2: 91% 94% 93% 96%  Weight: 124.8 kg     Height: 4\' 11"  (1.499 m)       Intake/Output Summary (Last 24 hours) at 11/14/2020 0802 Last data filed at 11/14/2020 14/10/2020 Gross per 24 hour  Intake 1880.86 ml  Output 1330 ml  Net 550.86 ml   Filed Weights   11/14/20 0500  Weight: 124.8 kg    Physical Exam   GEN: Obese, in no acute distress.  HEENT: Grossly normal.  Neck: Supple, obese, difficult to gauge JVP.  No carotid bruits, or masses. Cardiac: RRR, no murmurs, rubs, or gallops. No clubbing, cyanosis, edema.  Radials 2+, DP/PT 2+ and equal bilaterally.  Respiratory:  Respirations regular and unlabored, clear to auscultation bilaterally. GI: Soft, mild, diffuse tenderness. Lap dsgs d/i.  Nondistended, BS + x 4. MS: no deformity or atrophy. Skin: warm and dry, no rash. Neuro:  Strength and sensation are intact. Psych: AAOx3.  Normal affect.  Labs    Chemistry Recent Labs  Lab 11/12/20 1545 11/13/20 0343  NA 136 134*  K 3.6 3.5  CL 99 97*  CO2 23 28  GLUCOSE 116* 122*  BUN 8 6  CREATININE 0.80 0.77  CALCIUM 9.1 8.3*  PROT 8.8* 7.4  ALBUMIN 4.2 3.5  AST 30 35  ALT 19 26  ALKPHOS 70 66  BILITOT 0.8 0.7  GFRNONAA >60 >60  ANIONGAP 14 9      Hematology Recent Labs  Lab 11/12/20 1545 11/13/20 0343 11/14/20 0557  WBC 14.6* 13.3* 12.0*  RBC 5.57* 4.79 4.30  HGB 13.0 11.3* 10.2*  HCT 42.2 36.8 33.3*  MCV 75.8* 76.8* 77.4*  MCH 23.3* 23.6* 23.7*  MCHC 30.8 30.7 30.6  RDW 14.6 14.9 15.5  PLT 290 281 256    Cardiac Enzymes  Recent Labs  Lab 11/12/20 1545  TROPONINIHS 14  14      Radiology    DG Chest Portable 1 View  Result Date: 11/12/2020 CLINICAL DATA:  new onset Afib, eval infiltrate EXAM: PORTABLE CHEST 1 VIEW COMPARISON:  None. FINDINGS: No focal consolidation. No pneumothorax or pleural effusion. Cardiomediastinal silhouette is within normal limits. No acute osseous abnormality. IMPRESSION: No focal airspace disease. Electronically Signed   By: 14/08/2020 M.D.   On: 11/12/2020 16:12   14/08/2020 Abdomen Limited RUQ (LIVER/GB)  Result Date: 11/12/2020 CLINICAL DATA:  Initial evaluation for acute epigastric pain, vomiting. EXAM: ULTRASOUND ABDOMEN LIMITED RIGHT UPPER QUADRANT COMPARISON:  None. FINDINGS: Gallbladder: Multiple shadowing echogenic stones seen within the gallbladder lumen, largest of which measures up to 1.6 cm. Gallbladder wall thickened and edematous in appearance measuring up to 10-11 mm. Probable trace free pericholecystic fluid. No  sonographic Murphy sign elicited on exam. Common bile duct: Diameter: 7.2 mm Liver: 1.1 x 0.8 x 0.8 cm benign appearing cyst noted within the left hepatic lobe. 1.0 x 1.0 x 1.0 cm cyst noted within the right hepatic lobe. Additional right hepatic lobe cysts measure 0.9 x 1.0 x 0.8 cm. Increased echogenicity within the underlying hepatic parenchyma. Portal vein is patent on color Doppler imaging with normal direction of blood flow towards the liver. Other: None. IMPRESSION: 1. Cholelithiasis with associated gallbladder wall thickening and trace free pericholecystic fluid. Clinical correlation for possible acute cholecystitis recommended. 2. Mild dilatation of the common  bile duct measuring up to 7.2 mm. Correlation with LFTs recommended. 3. Increased echogenicity within the hepatic parenchyma, nonspecific, but most commonly seen in the setting of steatosis. 4. Few scattered benign-appearing hepatic cysts measuring up to 1.1 cm as above. Electronically Signed   By: Rise Mu M.D.   On: 11/12/2020 18:42    Telemetry    RSR  sinus tachycardia - 80's to 1-teens - Personally Reviewed  Cardiac Studies   2D Echocardiogram pending  Patient Profile     34 y.o. ? w/ a h/o mild asthma and morbid obesity, who presented 12/9 w/ epigastric pain, n, v, and afib w/ RVR, who was found to have acute cholecystitis, now s/p lap cholecystectomy.  Assessment & Plan    1.  Afib RVR:  Developed 12/9 in the setting of epigastric pain, n, v  acute cholecystitis.  Broke in ED on IV dilt.  CHA2DSVASc = 1 (gender).  Given low stroke risk and brief episode, no plan for [facility] at this time.  Echo pending - could be done as outpt if d/c'd today.  Of note, nsg staff reported desaturation into 80's during sleep.  No significant pauses seen on tele.  She should not need scheduled AVN blocking agent at discharge (received one dose of metoprolol preop yesterday).  Needs outpt sleep eval.  I will arrange for cardiology f/u in the next few wks.  2.  Acute cholecystitis:  S/p lap chole.  Feels well.  3.  H/o asthma:  Rarely uses albuterol @ home.    4.  Hypokalemia:  Supplemented for 3.5 yesterday.    5.  ? OSA:  Desaturations noted overnight.  Needs outpt sleep eval.  6.  Morbid obesity:  Discussed importance of caloric control and regular exercise w/ focus on sustainable weight loss.  Also discussed role of obesity in OSA and Afib.  She seems motivated to lose weight.  Signed, Nicolasa Ducking, NP  11/14/2020, 8:02 AM    Patient seen and examined  I agree with findings as noted by C Berge above    Pt remains in SR   Comfortable    ON exam, Lungs are CTA  Cardiac exam   RRR   No S3  No signif murmurs.  Ext are without edema AfIb:   As noted above pt should have echo and will be set up to be seen as outpt   The patient should have a sleep eval after d/c   Morbid obesity   I had a long talk with the pt   She has gained 80 lbs since moving to [place]   Eats A LOT of sugary foods, drinks    WIll make modifications  Wants to lose wt   Gave her videos to watch of sugar and Time Restricted Eating     Again  Follow as outpt   Dietrich Pates MD  For questions or updates, please contact   Please consult [email or web address] for contact info under Cardiology/STEMI.

## 2020-11-14 NOTE — Discharge Instructions (Signed)
Diet: Resume home heart healthy regular diet.   Activity: No heavy lifting >20 pounds (children, pets, laundry, garbage) or strenuous activity until follow-up, but light activity and walking are encouraged. Do not drive or drink alcohol if taking narcotic pain medications.  Wound care: May shower with soapy water and pat dry (do not rub incisions), but no baths or submerging incision underwater until follow-up. (no swimming)   Medications: Resume all home medications. For mild to moderate pain: acetaminophen (Tylenol) or ibuprofen (if no kidney disease). Combining Tylenol with alcohol can substantially increase your risk of causing liver disease. Narcotic pain medications, if prescribed, can be used for severe pain, though may cause nausea, constipation, and drowsiness. Do not combine Tylenol and Norco within a 6 hour period as Norco contains Tylenol. If you do not need the narcotic pain medication, you do not need to fill the prescription.  Call office [phone]) at any time if any questions, worsening pain, fevers/chills, bleeding, drainage from incision site, or other concerns.   Laparoscopic Cholecystectomy, Care After This sheet gives you information about how to care for yourself after your procedure. Your doctor may also give you more specific instructions. If you have problems or questions, contact your doctor. Follow these instructions at home: Care for cuts from surgery (incisions)   Follow instructions from your doctor about how to take care of your cuts from surgery. Make sure you: ? Wash your hands with soap and water before you change your bandage (dressing). If you cannot use soap and water, use hand sanitizer. ? Change your bandage as told by your doctor. ? Leave stitches (sutures), skin glue, or skin tape (adhesive) strips in place. They may need to stay in place for 2 weeks or longer. If tape strips get loose and curl up, you may trim the loose edges. Do not remove tape  strips completely unless your doctor says it is okay.  Do not take baths, swim, or use a hot tub until your doctor says it is okay. Ask your doctor if you can take showers. You may only be allowed to take sponge baths for bathing.  Check your surgical cut area every day for signs of infection. Check for: ? More redness, swelling, or pain. ? More fluid or blood. ? Warmth. ? Pus or a bad smell. Activity  Do not drive or use heavy machinery while taking prescription pain medicine.  Do not lift anything that is heavier than 10 lb (4.5 kg) until your doctor says it is okay.  Do not play contact sports until your doctor says it is okay.  Do not drive for 24 hours if you were given a medicine to help you relax (sedative).  Rest as needed. Do not return to work or school until your doctor says it is okay. General instructions  Take over-the-counter and prescription medicines only as told by your doctor.  To prevent or treat constipation while you are taking prescription pain medicine, your doctor may recommend that you: ? Drink enough fluid to keep your pee (urine) clear or pale yellow. ? Take over-the-counter or prescription medicines. ? Eat foods that are high in fiber, such as fresh fruits and vegetables, whole grains, and beans. ? Limit foods that are high in fat and processed sugars, such as fried and sweet foods. Contact a doctor if:  You develop a rash.  You have more redness, swelling, or pain around your surgical cuts.  You have more fluid or blood coming from your surgical cuts.  Your surgical cuts feel warm to the touch.  You have pus or a bad smell coming from your surgical cuts.  You have a fever.  One or more of your surgical cuts breaks open. Get help right away if:  You have trouble breathing.  You have chest pain.  You have pain that is getting worse in your shoulders.  You faint or feel dizzy when you stand.  You have very bad pain in your belly  (abdomen).  You are sick to your stomach (nauseous) for more than one day.  You have throwing up (vomiting) that lasts for more than one day.  You have leg pain. This information is not intended to replace advice given to you by your health care provider. Make sure you discuss any questions you have with your health care provider. Document Revised: 11/03/2017 Document Reviewed: 05/09/2016 Elsevier Patient Education  2020 [employer].

## 2020-11-14 NOTE — Discharge Summary (Signed)
Gail Wilkinson LPF:[id number] DOB: Jun 08, 1986 DOA: 11/12/2020  PCP: Patient, No Pcp Per  Admit date: 11/12/2020 Discharge date: 11/14/2020  Admitted From: Home Disposition: Home  Recommendations for Outpatient Follow-up:  1. Follow up with PCP in 1 week 2. Please obtain BMP/CBC in one week 3. Cardiology Dr. Mariah Milling in 1 week, will need echo done as outpatient 4. Dr. Verdis Prime in 1 week, general surgery      Discharge Condition:Stable CODE STATUS:full  Diet recommendation: Heart Healthy as tolerated Brief/Interim Summary: Gail Wilkinson is a 34 y.o. female with medical history significant for mild intermittent asthma who is admitted to [facility] on 11/12/2020 with atrial fibrillation with RVR after presenting from home to [facility] Emergency Department complaining of palpitations. She also reports intermittent nausea resulting in 3 episodes of nonbloody, nonbilious emesis over that timeframe. Right upper quadrant abdominal ultrasound showed evidence of cholelithiasis associated with gallbladder wall thickening and trace free pericholecystic fluid suggestive of acute cholelithiasis, while showing mild dilation of the common bile duct to 7.2 mm in the absence of any overt radiopaque stone within the common bile duct.  Presenting EKG showed atrial fibrillation with RVR.  Patient was admitted to the hospital service for sepsis and acute cholecystitis with A. fib RVR. Patient was started on IV antibiotics.  General surgery was consulted.  Cardiology was also consulted.  1. Afib rvr- new likely in setting of infection.  Needs to have sleep study as outpt  Chadsvasc 1, no need for a/c.  Discussed abstaining from caffine Echo will be done as outpatient  Initially was on cardizem gtt, converted to SR prior to her surgery Cardiology was following. Will need outpatient f/u in one week     2. Sepsis -2/2 acute cholecystitis- hemodynamically stable. Was started on ivabx-no need  to continue on discharge per surgery via chat   3. Acute cholecysitits- s/p robotic lap cholecystectomy Tolerated clears. Advance diet as tolerated Not much abdominal pain Wbc improving Surgery cleared pt for discharge  4.Mild intermittent asthma: -without acute exacerbation Continue MDI  Discharge Diagnoses:  Principal Problem:   Atrial fibrillation with RVR (HCC) Active Problems:   Acute cholecystitis   Sepsis (HCC)   Epigastric pain   Mild intermittent asthma    Discharge Instructions  Discharge Instructions    Diet - low sodium heart healthy   Complete by: As directed    Discharge instructions   Complete by: As directed    Follow up with cardiology DR. Gollan in one week F/U with surgery   Increase activity slowly   Complete by: As directed    No wound care   Complete by: As directed      Allergies as of 11/14/2020      Reactions   Shellfish Allergy Itching      Medication List    TAKE these medications   HYDROcodone-acetaminophen 5-325 MG tablet Commonly known as: Norco Take 1 tablet by mouth every 4 (four) hours as needed for up to 3 days for moderate pain.       Follow-up Information    Tonna Boehringer, Isami, DO Follow up in 2 week(s).   Specialty: Surgery Contact information: [address] [place] [address] [phone]        Antonieta Iba, MD Follow up in 1 week(s).   Specialty: Cardiology Why: f/u hospitalization for afib , need echo too Contact information: [address] [place] [address] [phone]  Allergies  Allergen Reactions  . Shellfish Allergy Itching    Consultations:  Cardiology  surgery   Procedures/Studies: DG Chest Portable 1 View  Result Date: 11/12/2020 CLINICAL DATA:  new onset Afib, eval infiltrate EXAM: PORTABLE CHEST 1 VIEW COMPARISON:  None. FINDINGS: No focal consolidation. No pneumothorax or pleural effusion. Cardiomediastinal silhouette is within normal  limits. No acute osseous abnormality. IMPRESSION: No focal airspace disease. Electronically Signed   By: Stana Bunting M.D.   On: 11/12/2020 16:12   US Abdomen Limited RUQ (LIVER/GB)  Result Date: 11/12/2020 CLINICAL DATA:  Initial evaluation for acute epigastric pain, vomiting. EXAM: ULTRASOUND ABDOMEN LIMITED RIGHT UPPER QUADRANT COMPARISON:  None. FINDINGS: Gallbladder: Multiple shadowing echogenic stones seen within the gallbladder lumen, largest of which measures up to 1.6 cm. Gallbladder wall thickened and edematous in appearance measuring up to 10-11 mm. Probable trace free pericholecystic fluid. No sonographic Murphy sign elicited on exam. Common bile duct: Diameter: 7.2 mm Liver: 1.1 x 0.8 x 0.8 cm benign appearing cyst noted within the left hepatic lobe. 1.0 x 1.0 x 1.0 cm cyst noted within the right hepatic lobe. Additional right hepatic lobe cysts measure 0.9 x 1.0 x 0.8 cm. Increased echogenicity within the underlying hepatic parenchyma. Portal vein is patent on color Doppler imaging with normal direction of blood flow towards the liver. Other: None. IMPRESSION: 1. Cholelithiasis with associated gallbladder wall thickening and trace free pericholecystic fluid. Clinical correlation for possible acute cholecystitis recommended. 2. Mild dilatation of the common bile duct measuring up to 7.2 mm. Correlation with LFTs recommended. 3. Increased echogenicity within the hepatic parenchyma, nonspecific, but most commonly seen in the setting of steatosis. 4. Few scattered benign-appearing hepatic cysts measuring up to 1.1 cm as above. Electronically Signed   By: Rise Mu M.D.   On: 11/12/2020 18:42       Subjective: Very minimal abdominal pain. No n/v. Tolerated feeding so far  Discharge Exam: Vitals:   11/14/20 0900 11/14/20 1100  BP:    Pulse: 93 99  Resp: (!) 21 (!) 22  Temp:    SpO2: 96% 94%   Vitals:   11/14/20 0700 11/14/20 0808 11/14/20 0900 11/14/20 1100  BP:   108/66    Pulse: 81 90 93 99  Resp: 17 18 (!) 21 (!) 22  Temp:  98.2 F (36.8 C)    TempSrc:  Oral    SpO2: 96% 100% 96% 94%  Weight:      Height:        General: Pt is alert, awake, not in acute distress Cardiovascular: RRR, S1/S2 +, no rubs, no gallops Respiratory: CTA bilaterally, no wheezing, no rhonchi Abdominal: Soft, NT, ND, bowel sounds + Extremities: no edema, no cyanosis    The results of significant diagnostics from this hospitalization (including imaging, microbiology, ancillary and laboratory) are listed below for reference.     Microbiology: Recent Results (from the past 240 hour(s))  Resp Panel by RT-PCR (Flu A&B, Covid) Nasopharyngeal Swab     Status: None   Collection Time: 11/12/20  3:45 PM   Specimen: Nasopharyngeal Swab; Nasopharyngeal(NP) swabs in vial transport medium  Result Value Ref Range Status   SARS Coronavirus 2 by RT PCR NEGATIVE NEGATIVE Final    Comment: (NOTE) SARS-CoV-2 target nucleic acids are NOT DETECTED.  The SARS-CoV-2 RNA is generally detectable in upper respiratory specimens during the acute phase of infection. The lowest concentration of SARS-CoV-2 viral copies this assay can detect is 138 copies/mL. A negative result does  not preclude SARS-Cov-2 infection and should not be used as the sole basis for treatment or other patient management decisions. A negative result may occur with  improper specimen collection/handling, submission of specimen other than nasopharyngeal swab, presence of viral mutation(s) within the areas targeted by this assay, and inadequate number of viral copies(<138 copies/mL). A negative result must be combined with clinical observations, patient history, and epidemiological information. The expected result is Negative.  Fact Sheet for Patients:  BloggerCourse.com  Fact Sheet for Healthcare Providers:  SeriousBroker.it  This test is no t yet approved or  cleared by the [place] FDA and  has been authorized for detection and/or diagnosis of SARS-CoV-2 by FDA under an Emergency Use Authorization (EUA). This EUA will remain  in effect (meaning this test can be used) for the duration of the COVID-19 declaration under Section 564(b)(1) of the Act, 21 U.S.C.section 360bbb-3(b)(1), unless the authorization is terminated  or revoked sooner.       Influenza A by PCR NEGATIVE NEGATIVE Final   Influenza B by PCR NEGATIVE NEGATIVE Final    Comment: (NOTE) The Xpert Xpress SARS-CoV-2/FLU/RSV plus assay is intended as an aid in the diagnosis of influenza from Nasopharyngeal swab specimens and should not be used as a sole basis for treatment. Nasal washings and aspirates are unacceptable for Xpert Xpress SARS-CoV-2/FLU/RSV testing.  Fact Sheet for Patients: BloggerCourse.com  Fact Sheet for Healthcare Providers: SeriousBroker.it  This test is not yet approved or cleared by the [place] FDA and has been authorized for detection and/or diagnosis of SARS-CoV-2 by FDA under an Emergency Use Authorization (EUA). This EUA will remain in effect (meaning this test can be used) for the duration of the COVID-19 declaration under Section 564(b)(1) of the Act, 21 U.S.C. section 360bbb-3(b)(1), unless the authorization is terminated or revoked.  Performed at [facility], [address] [place], [place] [address]      Labs: BNP (last 3 results) No results for input(s): BNP in the last 8760 hours. Basic Metabolic Panel: Recent Labs  Lab 11/12/20 1545 11/13/20 0343  NA 136 134*  K 3.6 3.5  CL 99 97*  CO2 23 28  GLUCOSE 116* 122*  BUN 8 6  CREATININE 0.80 0.77  CALCIUM 9.1 8.3*  MG 1.8 1.8   Liver Function Tests: Recent Labs  Lab 11/12/20 1545 11/13/20 0343  AST 30 35  ALT 19 26  ALKPHOS 70 66  BILITOT 0.8 0.7  PROT 8.8* 7.4  ALBUMIN 4.2 3.5   Recent Labs  Lab  11/12/20 1545  LIPASE 24   No results for input(s): AMMONIA in the last 168 hours. CBC: Recent Labs  Lab 11/12/20 1545 11/13/20 0343 11/14/20 0557  WBC 14.6* 13.3* 12.0*  NEUTROABS 12.3* 10.3*  --   HGB 13.0 11.3* 10.2*  HCT 42.2 36.8 33.3*  MCV 75.8* 76.8* 77.4*  PLT 290 281 256   Cardiac Enzymes: No results for input(s): CKTOTAL, CKMB, CKMBINDEX, TROPONINI in the last 168 hours. BNP: Invalid input(s): POCBNP CBG: No results for input(s): GLUCAP in the last 168 hours. D-Dimer No results for input(s): DDIMER in the last 72 hours. Hgb A1c No results for input(s): HGBA1C in the last 72 hours. Lipid Profile No results for input(s): CHOL, HDL, LDLCALC, TRIG, CHOLHDL, LDLDIRECT in the last 72 hours. Thyroid function studies Recent Labs    11/13/20 0343  TSH 0.623   Anemia work up No results for input(s): VITAMINB12, FOLATE, FERRITIN, TIBC, IRON, RETICCTPCT in the last 72  hours. Urinalysis    Component Value Date/Time   COLORURINE STRAW (A) 11/12/2020 2037   APPEARANCEUR CLEAR (A) 11/12/2020 2037   LABSPEC 1.011 11/12/2020 2037   PHURINE 7.0 11/12/2020 2037   GLUCOSEU NEGATIVE 11/12/2020 2037   HGBUR SMALL (A) 11/12/2020 2037   BILIRUBINUR NEGATIVE 11/12/2020 2037   KETONESUR 20 (A) 11/12/2020 2037   PROTEINUR 30 (A) 11/12/2020 2037   NITRITE NEGATIVE 11/12/2020 2037   LEUKOCYTESUR NEGATIVE 11/12/2020 2037   Sepsis Labs Invalid input(s): PROCALCITONIN,  WBC,  LACTICIDVEN Microbiology Recent Results (from the past 240 hour(s))  Resp Panel by RT-PCR (Flu A&B, Covid) Nasopharyngeal Swab     Status: None   Collection Time: 11/12/20  3:45 PM   Specimen: Nasopharyngeal Swab; Nasopharyngeal(NP) swabs in vial transport medium  Result Value Ref Range Status   SARS Coronavirus 2 by RT PCR NEGATIVE NEGATIVE Final    Comment: (NOTE) SARS-CoV-2 target nucleic acids are NOT DETECTED.  The SARS-CoV-2 RNA is generally detectable in upper respiratory specimens during the  acute phase of infection. The lowest concentration of SARS-CoV-2 viral copies this assay can detect is 138 copies/mL. A negative result does not preclude SARS-Cov-2 infection and should not be used as the sole basis for treatment or other patient management decisions. A negative result may occur with  improper specimen collection/handling, submission of specimen other than nasopharyngeal swab, presence of viral mutation(s) within the areas targeted by this assay, and inadequate number of viral copies(<138 copies/mL). A negative result must be combined with clinical observations, patient history, and epidemiological information. The expected result is Negative.  Fact Sheet for Patients:  BloggerCourse.comhttps://www.fda.gov/media/152166/download  Fact Sheet for Healthcare Providers:  SeriousBroker.ithttps://www.fda.gov/media/152162/download  This test is no t yet approved or cleared by the [place] FDA and  has been authorized for detection and/or diagnosis of SARS-CoV-2 by FDA under an Emergency Use Authorization (EUA). This EUA will remain  in effect (meaning this test can be used) for the duration of the COVID-19 declaration under Section 564(b)(1) of the Act, 21 U.S.C.section 360bbb-3(b)(1), unless the authorization is terminated  or revoked sooner.       Influenza A by PCR NEGATIVE NEGATIVE Final   Influenza B by PCR NEGATIVE NEGATIVE Final    Comment: (NOTE) The Xpert Xpress SARS-CoV-2/FLU/RSV plus assay is intended as an aid in the diagnosis of influenza from Nasopharyngeal swab specimens and should not be used as a sole basis for treatment. Nasal washings and aspirates are unacceptable for Xpert Xpress SARS-CoV-2/FLU/RSV testing.  Fact Sheet for Patients: BloggerCourse.comhttps://www.fda.gov/media/152166/download  Fact Sheet for Healthcare Providers: SeriousBroker.ithttps://www.fda.gov/media/152162/download  This test is not yet approved or cleared by the [place] FDA and has been authorized for detection and/or  diagnosis of SARS-CoV-2 by FDA under an Emergency Use Authorization (EUA). This EUA will remain in effect (meaning this test can be used) for the duration of the COVID-19 declaration under Section 564(b)(1) of the Act, 21 U.S.C. section 360bbb-3(b)(1), unless the authorization is terminated or revoked.  Performed at [facility], [address] [place], [place] [id number]      Time coordinating discharge: Over 30 minutes  SIGNED:   Lynn ItoSahar Diandra Cimini, MD  [facility] 11/14/2020, 12:01 PM Pager   If 7PM-7AM, please contact night-coverage

## 2020-11-14 NOTE — Progress Notes (Signed)
Pt discharged home. Discharge instructions, prescriptions, and follow up appointments given to and reviewed with pt. Pt verbalized understanding. Escorted by axillary.   

## 2020-11-14 NOTE — Progress Notes (Signed)
SURGICAL PROGRESS NOTE   Hospital Day(s): 2.   Post op day(s): 1 Day Post-Op.   Interval History: Patient seen and examined, no acute events or new complaints overnight. Patient reports feeling well.  She denies abdominal pain.  She denies any purulence from the surgical standpoint.  She is tolerating diet.  Minimal oozing from the wound controlled with simple gauze.  Vital signs in last 24 hours: [min-max] current  Temp:  [97 F (36.1 C)-99.1 F (37.3 C)] 98.2 F (36.8 C) (12/11 0808) Pulse Rate:  [70-99] 93 (12/11 0900) Resp:  [12-21] 21 (12/11 0900) BP: (94-141)/(47-96) 108/66 (12/11 0808) SpO2:  [88 %-100 %] 96 % (12/11 0900) Weight:  [124.8 kg] 124.8 kg (12/11 0500)     Height: 4\' 11"  (149.9 cm) Weight: 124.8 kg BMI (Calculated): 55.55   Physical Exam:  Constitutional: alert, cooperative and no distress  Respiratory: breathing non-labored at rest  Cardiovascular: regular rate and sinus rhythm  Gastrointestinal: soft, non-tender, and non-distended  Labs:  CBC Latest Ref Rng & Units 11/14/2020 11/13/2020 11/12/2020  WBC 4.0 - 10.5 K/uL 12.0(H) 13.3(H) 14.6(H)  Hemoglobin 12.0 - 15.0 g/dL 10.2(L) 11.3(L) 13.0  Hematocrit 36.0 - 46.0 % 33.3(L) 36.8 42.2  Platelets 150 - 400 K/uL 256 281 290   CMP Latest Ref Rng & Units 11/13/2020 11/12/2020  Glucose 70 - 99 mg/dL 14/08/2020) 161(W)  BUN 6 - 20 mg/dL 6 8  Creatinine 960(A - 1.00 mg/dL 5.40 9.81  Sodium 1.91 - 145 mmol/L 134(L) 136  Potassium 3.5 - 5.1 mmol/L 3.5 3.6  Chloride 98 - 111 mmol/L 97(L) 99  CO2 22 - 32 mmol/L 28 23  Calcium 8.9 - 10.3 mg/dL 8.3(L) 9.1  Total Protein 6.5 - 8.1 g/dL 7.4 478)  Total Bilirubin 0.3 - 1.2 mg/dL 0.7 0.8  Alkaline Phos 38 - 126 U/L 66 70  AST 15 - 41 U/L 35 30  ALT 0 - 44 U/L 26 19    Imaging studies: No new pertinent imaging studies   Assessment/Plan:  34 y.o. female with acute cholecystitis 1 Day Post-Op s/p robotic assisted laparoscopic cholecystectomy, complicated by pertinent  comorbidities including obesity, new onset A. fib.  Patient tolerated surgery well.  She is recovering adequately.  Pain is controlled.  She is tolerating diet.  Wounds are dry and clean.  From surgical standpoint if medically stable she can be discharge as per cardiology and hospitalist recommendation.  I will prescribe pain medication at home.  Patient was oriented that if the pain is mild she can control the pain with Tylenol or Advil but if it gets moderate she will have hydrocortisone available. No further surgical management. Will sign off. Patient will be followed as outpatient by Dr. 31 in two weeks.   Tonna Boehringer, MD

## 2020-11-16 NOTE — Anesthesia Postprocedure Evaluation (Signed)
Anesthesia Post Note  Patient: Gail Wilkinson  Procedure(s) Performed: XI ROBOTIC ASSISTED LAPAROSCOPIC CHOLECYSTECTOMY (N/A )  Patient location during evaluation: PACU Anesthesia Type: General Level of consciousness: awake and alert and oriented Pain management: pain level controlled Vital Signs Assessment: post-procedure vital signs reviewed and stable Respiratory status: spontaneous breathing Cardiovascular status: blood pressure returned to baseline Anesthetic complications: no   No complications documented.   Last Vitals:  Vitals:   11/14/20 0900 11/14/20 1100  BP:    Pulse: 93 99  Resp: (!) 21 (!) 22  Temp:    SpO2: 96% 94%    Last Pain:  Vitals:   11/14/20 0900  TempSrc:   PainSc: 4                  Jens Siems

## 2020-11-17 LAB — SURGICAL PATHOLOGY

## 2020-11-19 ENCOUNTER — Ambulatory Visit: Payer: 59 | Admitting: Nurse Practitioner

## 2020-12-10 ENCOUNTER — Ambulatory Visit: Payer: 59 | Admitting: Nurse Practitioner

## 2021-09-06 ENCOUNTER — Emergency Department
Admission: EM | Admit: 2021-09-06 | Discharge: 2021-09-06 | Disposition: A | Discharge status: Home / Self Care | Payer: 59 | Attending: Emergency Medicine | Admitting: Emergency Medicine

## 2021-09-06 ENCOUNTER — Other Ambulatory Visit: Payer: Self-pay

## 2021-09-06 ENCOUNTER — Emergency Department: Payer: 59

## 2021-09-06 DIAGNOSIS — R600 Localized edema: Secondary | ICD-10-CM | POA: Insufficient documentation

## 2021-09-06 DIAGNOSIS — J4520 Mild intermittent asthma, uncomplicated: Secondary | ICD-10-CM | POA: Insufficient documentation

## 2021-09-06 DIAGNOSIS — R0602 Shortness of breath: Secondary | ICD-10-CM

## 2021-09-06 DIAGNOSIS — R0600 Dyspnea, unspecified: Secondary | ICD-10-CM | POA: Insufficient documentation

## 2021-09-06 MED ORDER — ALBUTEROL SULFATE HFA 108 (90 BASE) MCG/ACT IN AERS
2.0000 | INHALATION_SPRAY | Freq: Once | RESPIRATORY_TRACT | Status: AC
  Administered 2021-09-06: 2 via RESPIRATORY_TRACT
  Filled 2021-09-06: qty 6.7

## 2021-09-06 NOTE — ED Provider Notes (Signed)
[facility]  ____________________________________________   Event Date/Time   First MD Initiated Contact with Patient 09/06/21 1201     (approximate)  I have reviewed the triage vital signs and the nursing notes.   HISTORY  Chief Complaint Asthma    HPI Gail Wilkinson is a 35 y.o. female past medical history of obesity and asthma who presents with dyspnea.  Symptoms started several days ago but worsened today.  She endorses some central chest pressure and difficulty breathing.  Has had some congestion but no cough.  The patient denies hx of prior DVT/PE, unilateral leg pain/swelling, hormone use, recent surgery, hx of cancer, prolonged immobilization, or hemoptysis.  She denies associate abdominal pain nausea vomiting.  No fevers or chills.  Has used some albuterol at home which helps temporarily.         Past Medical History:  Diagnosis Date   Mild intermittent asthma    Morbid obesity [facility])     Patient Active Problem List   Diagnosis Date Noted   Atrial fibrillation with RVR (HCC) 11/12/2020   Acute cholecystitis 11/12/2020   Sepsis (HCC) 11/12/2020   Epigastric pain 11/12/2020   Mild intermittent asthma     History reviewed. No pertinent surgical history.  Prior to Admission medications   Not on File    Allergies Shellfish allergy  Family History  Problem Relation Age of Onset   Other Mother        alive and well   Hypertension Sister    Metabolic syndrome Sister     Social History Social History   Tobacco Use   Smoking status: Never   Smokeless tobacco: Never  Substance Use Topics   Alcohol use: Not Currently   Drug use: Never    Review of Systems   Review of Systems  Constitutional:  Negative for chills and fever.  Respiratory:  Positive for chest tightness and shortness of breath. Negative for cough.   Cardiovascular:  Positive for chest pain. Negative for palpitations and leg swelling.  Gastrointestinal:   Negative for abdominal pain, nausea and vomiting.  All other systems reviewed and are negative.  Physical Exam Updated Vital Signs BP (!) 144/73 (BP Location: Right Arm)   Pulse 94   Temp 97.7 F (36.5 C) (Oral)   Resp (!) 22   Ht 4\' 11"  (1.499 m)   Wt 124.8 kg   LMP 08/24/2021 (Approximate)   SpO2 100%   BMI 55.57 kg/m   Physical Exam Vitals and nursing note reviewed.  Constitutional:      General: She is not in acute distress.    Appearance: Normal appearance.  HENT:     Head: Normocephalic and atraumatic.  Eyes:     General: No scleral icterus.    Conjunctiva/sclera: Conjunctivae normal.  Pulmonary:     Effort: Pulmonary effort is normal. No respiratory distress.     Breath sounds: No stridor.     Comments: Patient is tachypneic but is clear to auscultation bilaterally moving good air Musculoskeletal:        General: No deformity or signs of injury.     Cervical back: Normal range of motion.     Right lower leg: Edema present.     Left lower leg: Edema present.  Skin:    General: Skin is dry.     Coloration: Skin is not jaundiced or pale.  Neurological:     General: No focal deficit present.     Mental Status: She is alert  and oriented to person, place, and time. Mental status is at baseline.  Psychiatric:        Mood and Affect: Mood normal.        Behavior: Behavior normal.     LABS (all labs ordered are listed, but only abnormal results are displayed)  Labs Reviewed - No data to display ____________________________________________  EKG  Right axis deviation, normal sinus rhythm, normal intervals, no acute ischemic changes ____________________________________________  RADIOLOGY I, Randol Kern, personally viewed and evaluated these images (plain radiographs) as part of my medical decision making, as well as reviewing the written report by the radiologist.  ED MD interpretation:  I reviewed the CXR which does not show any acute cardiopulmonary  process      ____________________________________________   PROCEDURES  Procedure(s) performed (including Critical Care):  Procedures   ____________________________________________   INITIAL IMPRESSION / ASSESSMENT AND PLAN / ED COURSE     Patient is a 35 year old female who presents with dyspnea.  Symptoms started several days ago, she has some associated chest tightness.  No fevers, chills.  She has no pulmonary embolism risk factors, normal heart rate and 100% oxygen on room air.  On exam she is somewhat tachypneic but lungs are clear and she is moving good air.  No objective signs of DVT on exam.  Her EKG does have right axis deviation but no acute ischemic changes.  Chest x-ray within normal limits.  She was given albuterol and her dyspnea did improve.  I considered pulmonary embolism however she is PERC negative and with her normal vital signs feel that likelihood is significantly low.  Patient is stable for discharge.  We discussed return precautions.      ____________________________________________   FINAL CLINICAL IMPRESSION(S) / ED DIAGNOSES  Final diagnoses:  Shortness of breath     ED Discharge Orders     None        Note:  This document was prepared using Dragon voice recognition software and may include unintentional dictation errors.    Georga Hacking, MD 09/06/21 1352

## 2021-09-06 NOTE — Discharge Instructions (Addendum)
Your chest x-ray and EKG were reassuring today.  We are not exactly sure what is causing your difficulty breathing.  If you develop worsening symptoms, please return to the emergency department.

## 2021-09-06 NOTE — ED Notes (Signed)
See triage note  states she developed an asthma flare over the weekend   has been using inhalers   but feels like it doesn't help  no fever

## 2021-09-06 NOTE — ED Triage Notes (Signed)
Pt c/o asthma flaring up over the weekend, states she has been using her inhalers with no relief.. no audible wheezing noted.

## 2021-09-06 NOTE — ED Provider Notes (Signed)
Emergency Medicine Provider Triage Evaluation Note  Gail Wilkinson, a 35 y.o. female  was evaluated in triage.  Pt complains of asthma flare since the weekend.  Patient has noted some increased shortness of breath with exertion.  She denies any chest pain, fevers, chills, or sweats.  She is using her rescue inhaler with increased frequency..  Review of Systems  Positive: SOB Negative: FCS  Physical Exam  BP (!) 144/73 (BP Location: Right Arm)   Pulse 94   Temp 97.7 F (36.5 C) (Oral)   Resp (!) 22   LMP 08/24/2021 (Approximate)   SpO2 100%  Gen:   Awake, no distress  NAD Resp:  Normal effort CTA, hyperventilating MSK:   Moves extremities without difficulty Other:   Other:  CVS: RRR  Medical Decision Making  Medically screening exam initiated at 11:40 AM.  Appropriate orders placed.  Gail Wilkinson was informed that the remainder of the evaluation will be completed by another provider, this initial triage assessment does not replace that evaluation, and the importance of remaining in the ED until their evaluation is complete.  Patient ED evaluation of asthma exacerbation.   [name], PA-C 09/06/21 1141    Georga Hacking, MD 09/06/21 [phone]

## 2022-01-04 ENCOUNTER — Ambulatory Visit: Payer: 59 | Admitting: Nurse Practitioner

## 2022-03-04 ENCOUNTER — Ambulatory Visit: Payer: 59 | Admitting: Nurse Practitioner

## 2022-04-25 IMAGING — US US ABDOMEN LIMITED
1 series · 15 of 25 positions shown · non-contrast
Comparison: None.

CLINICAL DATA: Initial evaluation for acute epigastric pain,
vomiting.

EXAM:
ULTRASOUND ABDOMEN LIMITED RIGHT UPPER QUADRANT

[Series 1: us abdomen limited ruq · 15 of 61 slices shown]
[im 1/61]
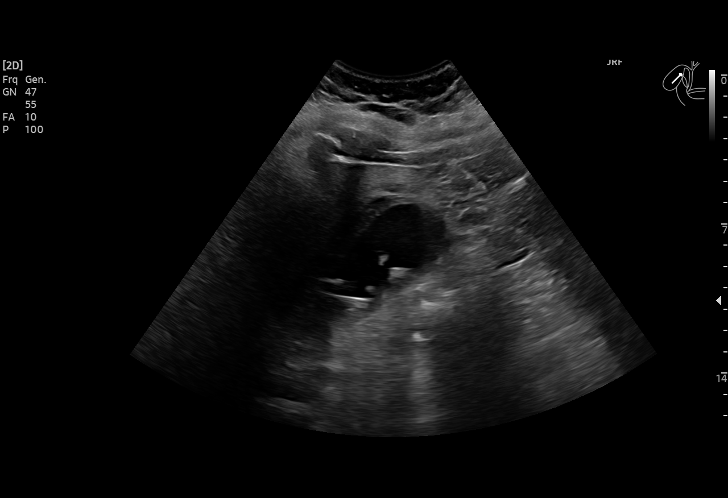
[im 6/61]
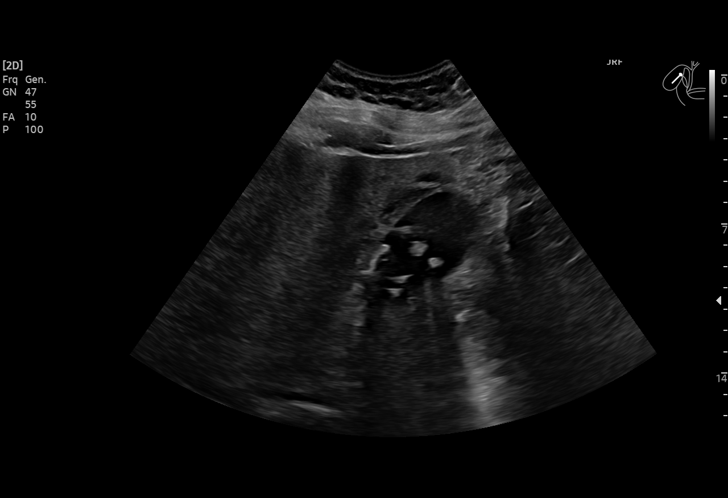
[im 11/61]
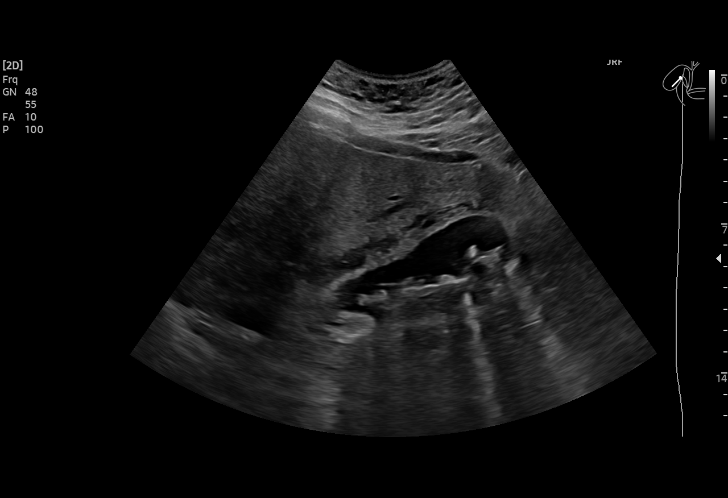
[im 13/61]
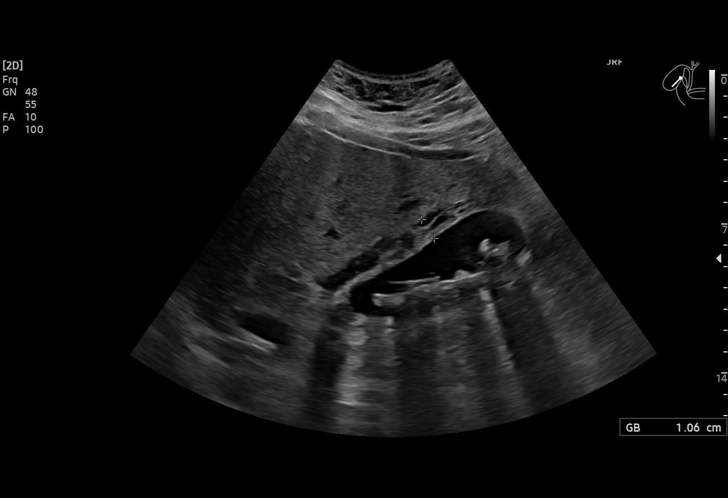
[im 18/61]
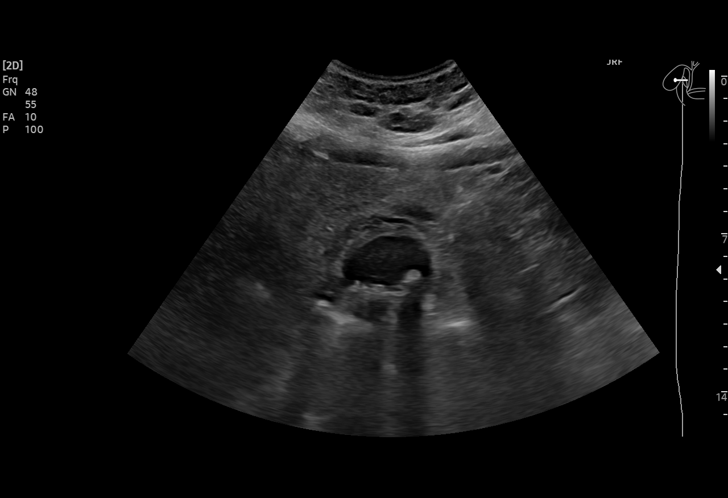
[im 23/61]
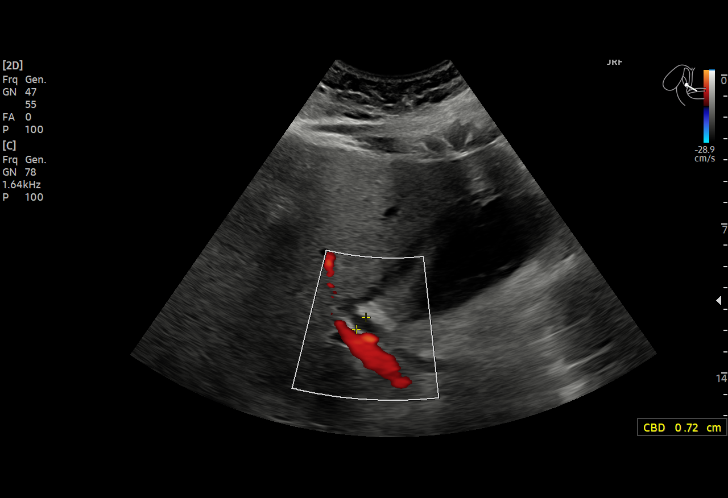
[im 26/61]
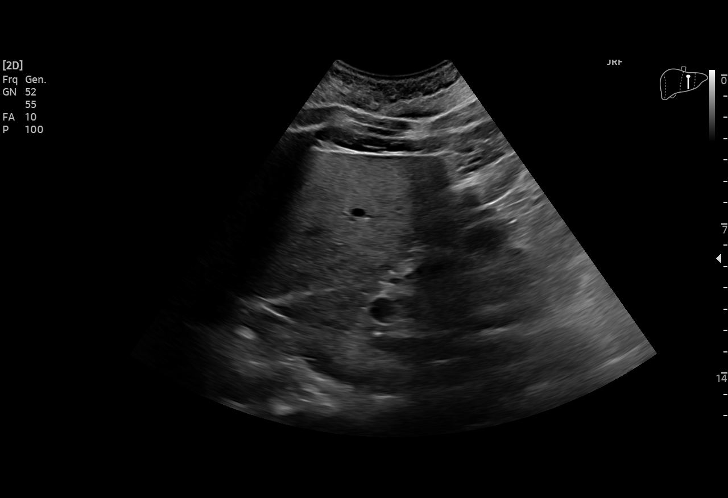
[im 31/61]
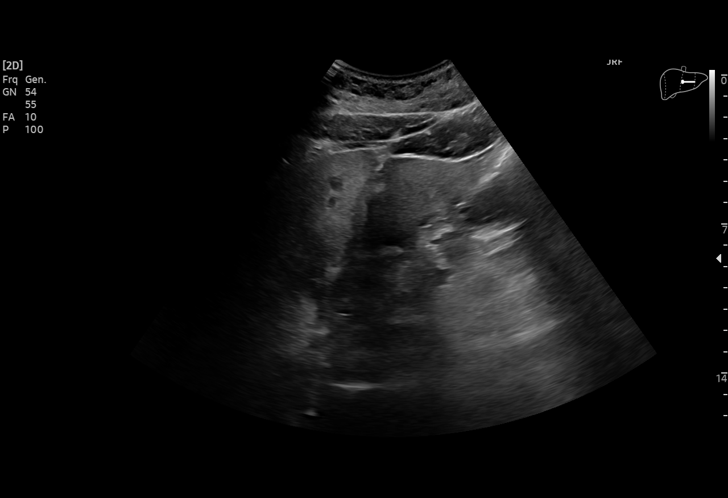
[im 36/61]
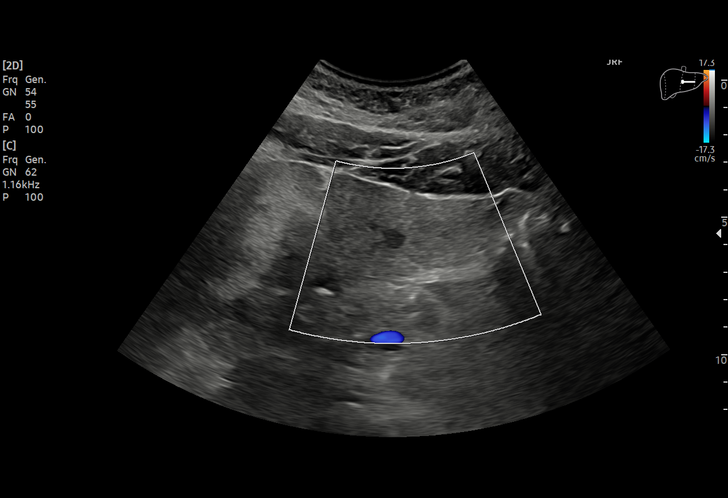
[im 38/61]
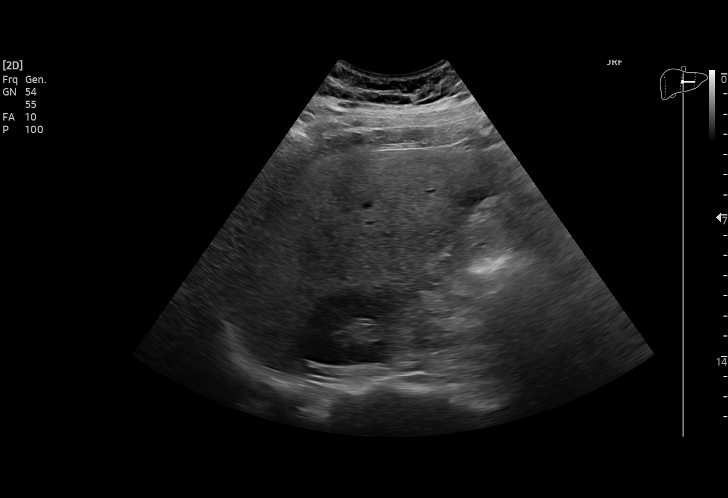
[im 43/61]
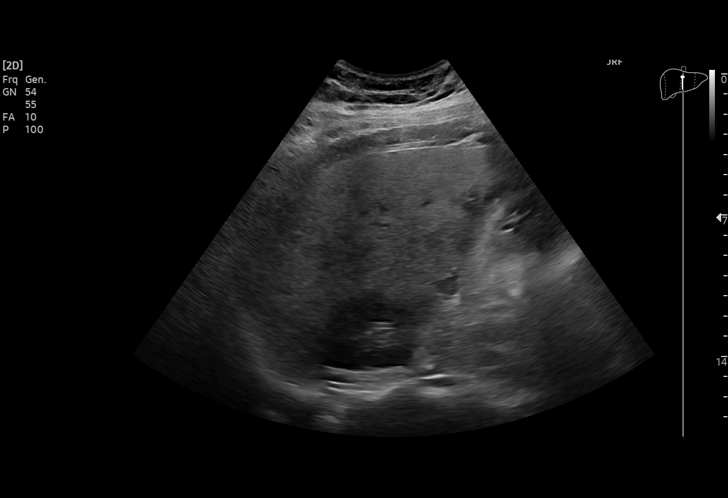
[im 48/61]
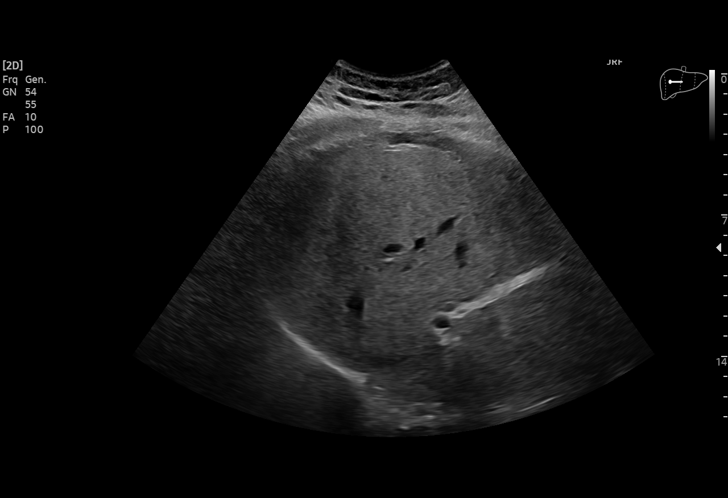
[im 51/61]
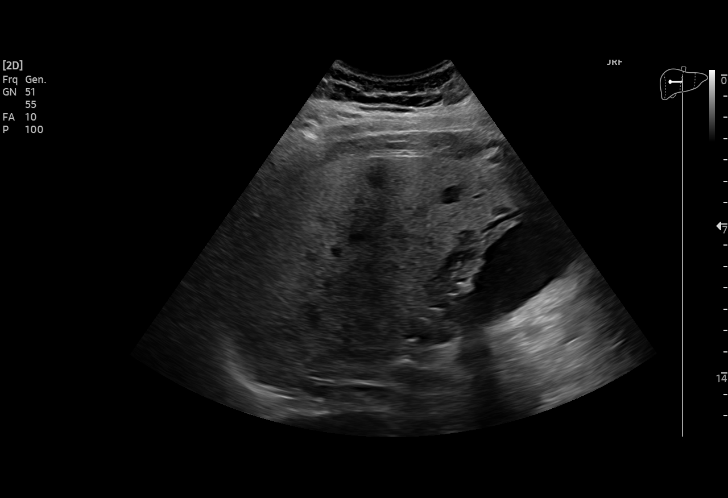
[im 56/61]
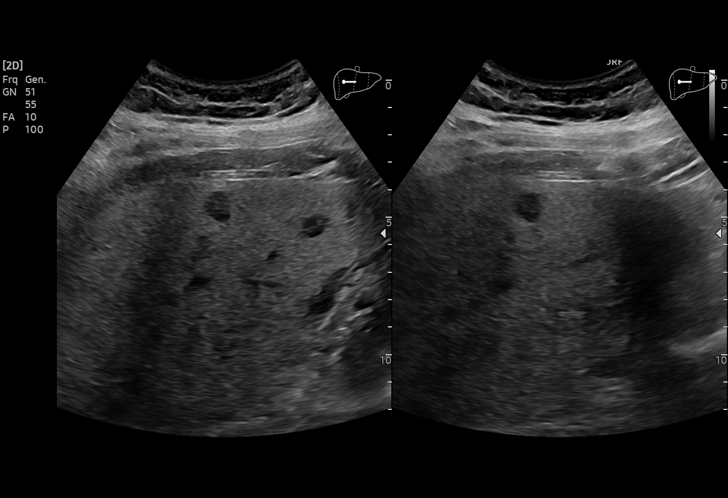
[im 61/61]
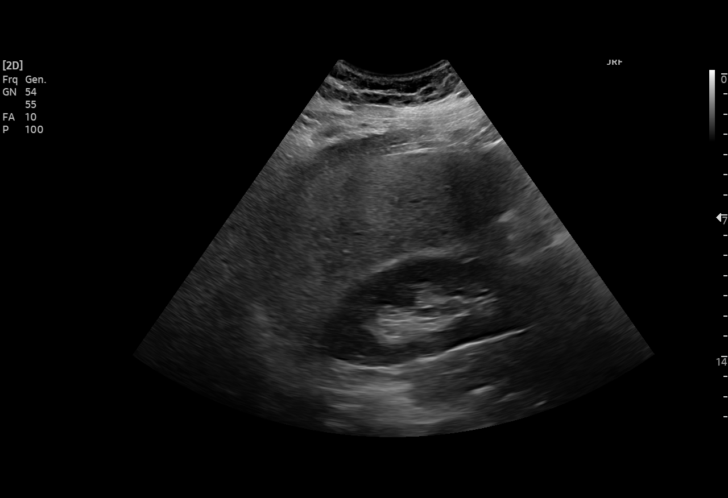

[15 of 25 positions shown; findings below may reference images not displayed]

FINDINGS: Gallbladder:

Multiple shadowing echogenic stones seen within the gallbladder
lumen, largest of which measures up to 1.6 cm. Gallbladder wall
thickened and edematous in appearance measuring up to 10-11 mm.
Probable trace free pericholecystic fluid. No sonographic Murphy
sign elicited on exam.

Common bile duct:

Diameter: 7.2 mm

Liver:

1.1 x 0.8 x 0.8 cm benign appearing cyst noted within the left
hepatic lobe. 1.0 x 1.0 x 1.0 cm cyst noted within the right hepatic
lobe. Additional right hepatic lobe cysts measure 0.9 x 1.0 x
cm. Increased echogenicity within the underlying hepatic parenchyma.
Portal vein is patent on color Doppler imaging with normal direction
of blood flow towards the liver.

Other: None.
IMPRESSION: 1. Cholelithiasis with associated gallbladder wall thickening and
trace free pericholecystic fluid. Clinical correlation for possible
acute cholecystitis recommended.
2. Mild dilatation of the common bile duct measuring up to 7.2 mm.
Correlation with LFTs recommended.
3. Increased echogenicity within the hepatic parenchyma,
nonspecific, but most commonly seen in the setting of steatosis.
4. Few scattered benign-appearing hepatic cysts measuring up to
cm as above.

## 2023-02-17 IMAGING — CR DG CHEST 2V
2 series · 2 of 2 positions shown · non-contrast
Comparison: November 12, 2020.

CLINICAL DATA: Cough, shortness of breath.

EXAM:
CHEST - 2 VIEW

[chest pa]
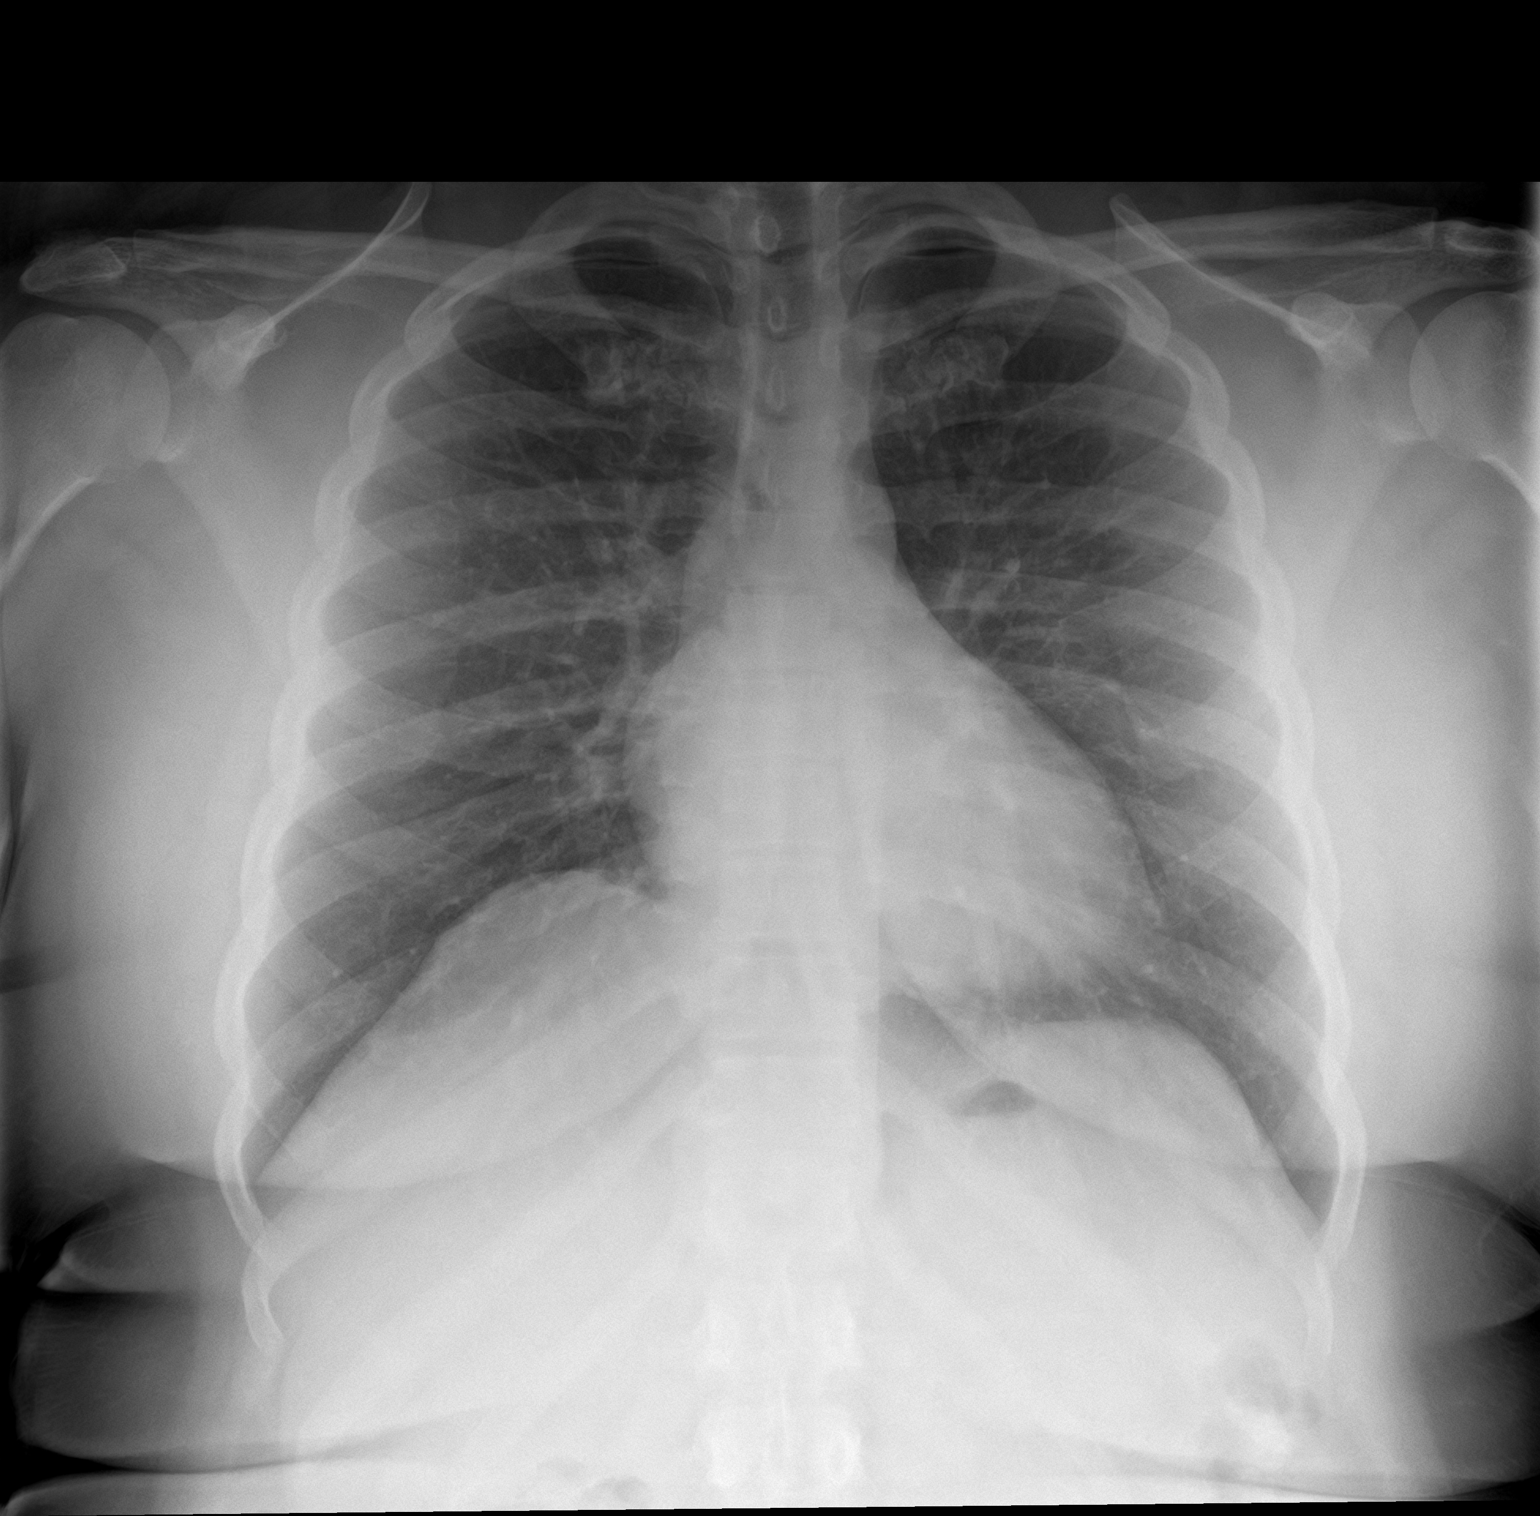

[chest lat]
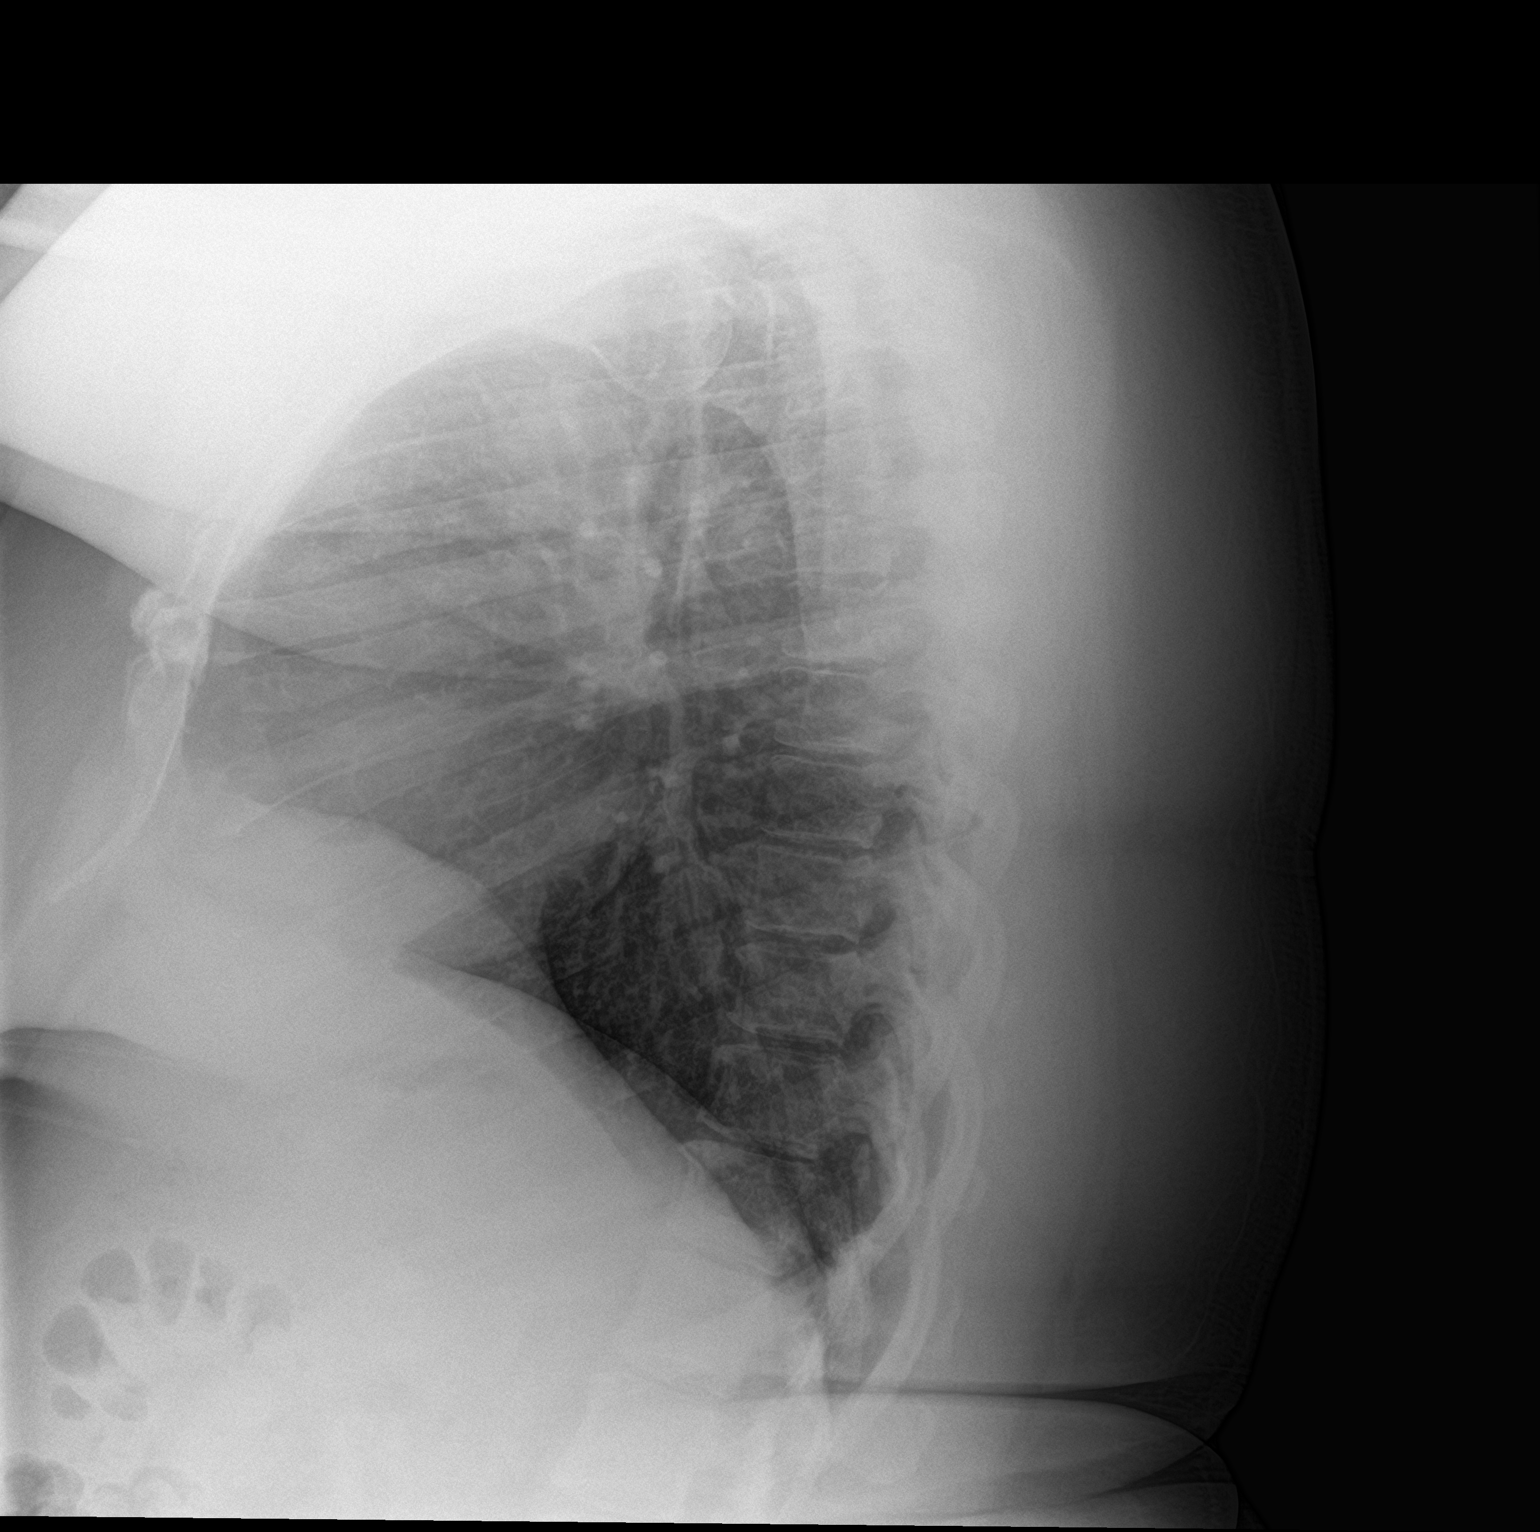

[2 of 2 positions shown; findings below may reference images not displayed]

FINDINGS: The heart size and mediastinal contours are within normal limits.
Both lungs are clear. The visualized skeletal structures are
unremarkable.
IMPRESSION: No active cardiopulmonary disease.

## 2024-07-02 ENCOUNTER — Other Ambulatory Visit: Payer: Self-pay | Admitting: Medical Genetics

## 2024-08-06 ENCOUNTER — Ambulatory Visit: Admitting: Podiatry

## 2024-08-13 ENCOUNTER — Ambulatory Visit (INDEPENDENT_AMBULATORY_CARE_PROVIDER_SITE_OTHER): Admitting: Podiatry

## 2024-08-13 ENCOUNTER — Ambulatory Visit (INDEPENDENT_AMBULATORY_CARE_PROVIDER_SITE_OTHER)

## 2024-08-13 ENCOUNTER — Encounter: Payer: Self-pay | Admitting: Podiatry

## 2024-08-13 VITALS — Ht 59.0 in | Wt 275.1 lb

## 2024-08-13 DIAGNOSIS — M7752 Other enthesopathy of left foot: Secondary | ICD-10-CM | POA: Diagnosis not present

## 2024-08-13 DIAGNOSIS — M21619 Bunion of unspecified foot: Secondary | ICD-10-CM

## 2024-08-13 DIAGNOSIS — M21612 Bunion of left foot: Secondary | ICD-10-CM

## 2024-08-13 MED ORDER — BETAMETHASONE SOD PHOS & ACET 6 (3-3) MG/ML IJ SUSP
3.0000 mg | Freq: Once | INTRAMUSCULAR | Status: AC
  Administered 2024-08-13: 3 mg via INTRA_ARTICULAR

## 2024-08-13 MED ORDER — MELOXICAM 15 MG PO TABS: 15.0000 mg | ORAL_TABLET | Freq: Every day | ORAL | 1 refills | Status: AC

## 2024-08-13 NOTE — Progress Notes (Signed)
   Chief Complaint  Patient presents with   Bunions    Pt is here due to left bunion pain, she states the pain has been on and off for a while, states she has wide feet and most of her shoes are narrow. No issues with the right foot.    HPI: 38 y.o. female presenting today for evaluation of pain and tenderness associated to the left great toe joint.  Onset about 2 weeks ago after wearing a poor fitting pair of shoes for her 38th birthday party.  She has not done anything for treatment  Past Medical History:  Diagnosis Date   Mild intermittent asthma    Morbid obesity (HCC)     No past surgical history on file.  Allergies  Allergen Reactions   Shellfish Allergy Itching     Physical Exam: General: The patient is alert and oriented x3 in no acute distress.  Dermatology: Skin is warm, dry and supple bilateral lower extremities.   Vascular: Palpable pedal pulses bilaterally. Capillary refill within normal limits.  No appreciable edema.  No erythema.  Neurological: Grossly intact via light touch  Musculoskeletal Exam: No pedal deformities noted.  Tenderness to palpation and range of motion of the first MTP of the left foot.  No erythema.  No crepitus with range of motion.  Radiographic Exam LT foot 08/13/2024:  Normal osseous mineralization. Joint spaces preserved.  No fractures or osseous irregularities noted.  Impression: Negative  Assessment/Plan of Care: 1.  Metatarsophalangeal capsulitis left  -Patient evaluated.  X-rays reviewed -Injection of 0.5 cc Celestone  Soluspan injected in the first MTP left -Prescription for meloxicam  15 mg daily PRN -Recommend wide fitting shoes that do not irritate or constrict the toebox area -Return to clinic PRN       Thresa EMERSON Sar, DPM [facility]  Dr. Thresa EMERSON Sar, DPM    [address] [place], [place] [address]                Office [phone]  Fax [phone]

## 2024-09-13 ENCOUNTER — Other Ambulatory Visit: Payer: Self-pay | Admitting: Medical Genetics

## 2024-09-13 DIAGNOSIS — Z006 Encounter for examination for normal comparison and control in clinical research program: Secondary | ICD-10-CM
